# Patient Record
Sex: Male | Born: 1960 | Race: Black or African American | Hispanic: No | Smoking: Current every day smoker
Health system: Southern US, Community
[De-identification: ages and names within clinical notes are randomized; demographics above are authoritative.]

## PROBLEM LIST (undated history)

## (undated) DIAGNOSIS — F1010 Alcohol abuse, uncomplicated: Secondary | ICD-10-CM

## (undated) DIAGNOSIS — F419 Anxiety disorder, unspecified: Secondary | ICD-10-CM

## (undated) DIAGNOSIS — I10 Essential (primary) hypertension: Secondary | ICD-10-CM

## (undated) DIAGNOSIS — D649 Anemia, unspecified: Secondary | ICD-10-CM

## (undated) DIAGNOSIS — N179 Acute kidney failure, unspecified: Secondary | ICD-10-CM

## (undated) DIAGNOSIS — I824Y9 Acute embolism and thrombosis of unspecified deep veins of unspecified proximal lower extremity: Secondary | ICD-10-CM

## (undated) HISTORY — DX: Essential (primary) hypertension: I10

## (undated) HISTORY — DX: Anxiety disorder, unspecified: F41.9

## (undated) HISTORY — PX: TOOTH EXTRACTION: SUR596

---

## 2013-01-30 ENCOUNTER — Ambulatory Visit: Payer: Self-pay | Admitting: Internal Medicine

## 2013-08-12 ENCOUNTER — Emergency Department (HOSPITAL_COMMUNITY): Payer: Managed Care, Other (non HMO)

## 2013-08-12 ENCOUNTER — Encounter (HOSPITAL_COMMUNITY): Payer: Self-pay | Admitting: Emergency Medicine

## 2013-08-12 ENCOUNTER — Emergency Department (HOSPITAL_COMMUNITY)
Admission: EM | Admit: 2013-08-12 | Discharge: 2013-08-12 | Disposition: A | Discharge status: Home / Self Care | Payer: Managed Care, Other (non HMO) | Attending: Emergency Medicine | Admitting: Emergency Medicine

## 2013-08-12 DIAGNOSIS — F17200 Nicotine dependence, unspecified, uncomplicated: Secondary | ICD-10-CM | POA: Insufficient documentation

## 2013-08-12 DIAGNOSIS — R079 Chest pain, unspecified: Secondary | ICD-10-CM

## 2013-08-12 DIAGNOSIS — F411 Generalized anxiety disorder: Secondary | ICD-10-CM | POA: Insufficient documentation

## 2013-08-12 DIAGNOSIS — Z86718 Personal history of other venous thrombosis and embolism: Secondary | ICD-10-CM | POA: Insufficient documentation

## 2013-08-12 DIAGNOSIS — F419 Anxiety disorder, unspecified: Secondary | ICD-10-CM

## 2013-08-12 DIAGNOSIS — R072 Precordial pain: Secondary | ICD-10-CM | POA: Insufficient documentation

## 2013-08-12 DIAGNOSIS — F3289 Other specified depressive episodes: Secondary | ICD-10-CM | POA: Insufficient documentation

## 2013-08-12 DIAGNOSIS — R000 Tachycardia, unspecified: Secondary | ICD-10-CM | POA: Insufficient documentation

## 2013-08-12 DIAGNOSIS — F329 Major depressive disorder, single episode, unspecified: Secondary | ICD-10-CM | POA: Insufficient documentation

## 2013-08-12 LAB — D-DIMER, QUANTITATIVE: D-Dimer, Quant: <0.27 ug/mL-FEU (ref 0.00–0.48)

## 2013-08-12 LAB — CBC
HEMATOCRIT: 46.1 % (ref 39.0–52.0)
Hemoglobin: 16.6 g/dL (ref 13.0–17.0)
MCH: 31.7 pg (ref 26.0–34.0)
MCHC: 36 g/dL (ref 30.0–36.0)
MCV: 88.1 fL (ref 78.0–100.0)
Platelets: 267 10*3/uL (ref 150–400)
RBC: 5.23 MIL/uL (ref 4.22–5.81)
RDW: 14.1 % (ref 11.5–15.5)
WBC: 7.1 10*3/uL (ref 4.0–10.5)

## 2013-08-12 LAB — BASIC METABOLIC PANEL
BUN: 18 mg/dL (ref 6–23)
CALCIUM: 9.5 mg/dL (ref 8.4–10.5)
CO2: 19 mEq/L (ref 19–32)
Chloride: 90 mEq/L — ABNORMAL LOW (ref 96–112)
Creatinine, Ser: 0.9 mg/dL (ref 0.50–1.35)
GFR calc Af Amer: >90 mL/min (ref >90)
Glucose, Bld: 106 mg/dL — ABNORMAL HIGH (ref 70–99)
Potassium: 4 mEq/L (ref 3.7–5.3)
Sodium: 132 mEq/L — ABNORMAL LOW (ref 137–147)

## 2013-08-12 LAB — I-STAT TROPONIN, ED: Troponin i, poc: 0 ng/mL (ref 0.00–0.08)

## 2013-08-12 LAB — TROPONIN I: Troponin I: <0.3 ng/mL (ref <0.30)

## 2013-08-12 MED ORDER — ASPIRIN 81 MG PO CHEW: 81.0000 mg | CHEWABLE_TABLET | Freq: Every day | ORAL | Status: DC

## 2013-08-12 MED ORDER — SODIUM CHLORIDE 0.9 % IV BOLUS (SEPSIS)
1000.0000 mL | Freq: Once | INTRAVENOUS | Status: AC
  Administered 2013-08-12: 1000 mL via INTRAVENOUS

## 2013-08-12 NOTE — ED Notes (Signed)
Pt went to an urgent care and was having left sided chest pressure with radiation under axilla.  No sob, diaphoresis or nausea.  Pt thought he may have been having a panic attack and reports just losing father, no history of panic attacks.  Pt states has missed three days of not taking BP meds

## 2013-08-12 NOTE — ED Provider Notes (Signed)
CSN: [id number]     Arrival date & time 08/12/13  1619 History   First MD Initiated Contact with Patient 08/12/13 1729     Chief Complaint  Patient presents with  . Chest Pain     (Consider location/radiation/quality/duration/timing/severity/associated sxs/prior Treatment) HPI Comments: 53 yo AA male with h/o smoking presents to ER with cc chest pain and anxiety. [facility]  EKG was performed and he was found to be tachycardia.  Given ASA x4.  [facility]   O: yesterday, intermittent symptoms;  P: n/a Q: ache R: chest S:mild T: pt buried his father 1 week ago.  [place]  [place] on 2[place].  Pt hasn't been taking BP rx for 3 days.  Pt suspects panic attack, but isn't sure.    Currently pt is pain free.    Patient is a 53 y.o. male presenting with chest pain. The history is provided by the patient. The history is limited by a language barrier.  Chest Pain Pain location:  Substernal area and L chest Pain quality: aching   Pain radiates to:  L arm Pain radiates to the back: no   Pain severity:  Mild Duration:  2 days Timing:  Intermittent Progression:  Waxing and waning Chronicity:  New Context: not breathing, no drug use, not eating, no intercourse, not lifting, no movement, not raising an arm, not at rest, no stress and no trauma   Relieved by:  None tried Worsened by:  Nothing tried Associated symptoms: anxiety   Associated symptoms: no cough, no palpitations and no shortness of breath   Associated symptoms comment:  Anxiety Risk factors: hypertension, male sex, prior DVT/PE and smoking   Risk factors: no aortic disease, no birth control, no coronary artery disease, no diabetes mellitus, no Ehlers-Danlos syndrome, no high cholesterol, no immobilization, not obese, not pregnant and no surgery   Risk factors comment:  H/o DVT 2005, 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 No pertinent past medical history. History reviewed. No pertinent past surgical history. No family history on file. History  Substance Use Topics  . Smoking status: Current Every Day Smoker  . Smokeless tobacco: Not on file  . Alcohol Use: Yes     Comment: occ    Review of Systems  Constitutional: Negative.   HENT: Negative.   Eyes: Negative.   Respiratory: Positive for chest tightness. Negative for apnea, cough, choking, shortness of breath, wheezing and stridor.   Cardiovascular: Positive for chest pain. Negative for palpitations and leg swelling.  Gastrointestinal: Negative.   Endocrine: Negative.   Genitourinary: Negative.   Musculoskeletal: Negative.   Allergic/Immunologic: Negative.   Neurological: Negative.   Hematological: Negative.   Psychiatric/Behavioral:       Depressed over loss of father      Allergies  Review of patient's allergies indicates no known allergies.  Home Medications  No current outpatient prescriptions on file. BP 138/88  Pulse 125  Temp(Src) 98.5 F (36.9 C) (Oral)  Resp 23  SpO2 100% Physical Exam  Nursing note and vitals reviewed. Constitutional: He is oriented to person, place, and time. He appears well-developed and well-nourished.  HENT:  Head: Normocephalic and atraumatic.  Eyes: Conjunctivae are normal. Right eye exhibits no discharge. Left eye exhibits no discharge.  Neck: Normal range of motion. Neck supple. No JVD present. No tracheal  deviation present.  Cardiovascular: S1 normal, S2 normal and normal pulses.  Tachycardia present.  Exam reveals no gallop, no distant heart sounds and no friction rub.   Pulmonary/Chest: Effort normal and breath sounds normal. No stridor. He has no wheezes. He has no rales.  Abdominal: Soft. Bowel sounds are normal. He exhibits no distension and no mass. There is no tenderness. There is no rebound and no guarding.  Musculoskeletal: Normal range of motion. He exhibits no  edema and no tenderness.  Neurological: He is alert and oriented to person, place, and time. He has normal reflexes.  Skin: Skin is warm and dry.    ED Course  Procedures (including critical care time) Labs Review Labs Reviewed  CBC  BASIC METABOLIC PANEL  I-STAT TROPOININ, ED   Imaging Review No results found.   EKG Interpretation   Date/Time:  Monday August 12 2013 16:35:18 EST Ventricular Rate:  148 PR Interval:  130 QRS Duration: 76 QT Interval:  274 QTC Calculation: 430 R Axis:   75 Text Interpretation:  Sinus tachycardia ST \\T \ T wave abnormality,  consider inferior ischemia Abnormal ECG Confirmed by [facility]  MD, Keelan Tripodi  ([id number]) on 08/12/2013 5:30:29 PM      CBC, BMP, Trop, DDimer negative. Trop # 1 negative.     MDM   Final diagnoses:  Chest pain  Tachycardia  Anxiety   53 year old [place] American male with past mental history of a remote DVT and history of anxiety presents emergency department with tachycardia and chest pain. Patient recently lost his father last week. He attended a funeral in [place]. He flew back and forth from [place] last week. He has been upset regarding the loss of his father.  Patient initially had tachycardia which resolves with IV fluids and while resting in bed.  8:54 PM Initial workup neg.  CXR, EKG (other than tachycardia), trop, DDimer, CBC, BMP, CXR.  Plan for two set troponin and EKG r/o.  Low risk based on HEART score.  After first set of labs, EKG, chest x-ray patient was feeling much better and requested discharge home. I convinced him to allow is to do a 2 set rule out.Repeat EKG with Sinus tachycardia at 109, no ischemic changes.    Prolonged ER stay awaiting second troponin. Lab called at 2223.  Second troponin is a little as negative. Plan to discharge patient home with strict ER return precautions and possible outpatient evaluation. I suspect the patient's symptoms are multifactorial, however I doubt acute myocardial  infarction, PE, aortic dissection, or other emergent etiology. Patient agrees with outpatient plan.  Darlys Gales, MD 08/12/13 [phone]

## 2013-08-12 NOTE — Discharge Instructions (Signed)
Chest Pain (Nonspecific) Chest pain has many causes. Your pain could be caused by something serious, such as a heart attack or a blood clot in the lungs. It could also be caused by something less serious, such as a chest bruise or a virus. Follow up with your doctor. More lab tests or other studies may be needed to find the cause of your pain. Most of the time, nonspecific chest pain will improve within 2 to 3 days of rest and mild pain medicine. HOME CARE  For chest bruises, you may put ice on the sore area for 15-20 minutes, 03-04 times a day. Do this only if it makes you feel better.  Put ice in a plastic bag.  Place a towel between the skin and the bag.  Rest for the next 2 to 3 days.  Go back to work if the pain improves.  See your doctor if the pain lasts longer than 1 to 2 weeks.  Only take medicine as told by your doctor.  Quit smoking if you smoke. GET HELP RIGHT AWAY IF:   There is more pain or pain that spreads to the arm, neck, jaw, back, or belly (abdomen).  You have shortness of breath.  You cough more than usual or cough up blood.  You have very bad back or belly pain, feel sick to your stomach (nauseous), or throw up (vomit).  You have very bad weakness.  You pass out (faint).  You have a fever. Any of these problems may be serious and may be an emergency. Do not wait to see if the problems will go away. Get medical help right away. Call your local emergency services 911 in [place] Do not drive yourself to the hospital. MAKE SURE YOU:   Understand these instructions.  Will watch this condition.  Will get help right away if you or your child is not doing well or gets worse. Document Released: 11/16/2007 Document Revised: 08/22/2011 Document Reviewed: 11/16/2007 [facility] Patient Information 2014 [place] [place].  Panic Attacks Panic attacks are sudden, short-livedsurges of severe anxiety, fear, or discomfort. They may occur for no reason when you are  relaxed, when you are anxious, or when you are sleeping. Panic attacks may occur for a number of reasons:   Healthy people occasionally have panic attacks in extreme, life-threatening situations, such as war or natural disasters. Normal anxiety is a protective mechanism of the body that helps us react to danger (fight or flight response).  Panic attacks are often seen with anxiety disorders, such as panic disorder, social anxiety disorder, generalized anxiety disorder, and phobias. Anxiety disorders cause excessive or uncontrollable anxiety. They may interfere with your relationships or other life activities.  Panic attacks are sometimes seen with other mental illnesses such as depression and posttraumatic stress disorder.  Certain medical conditions, prescription medicines, and drugs of abuse can cause panic attacks. SYMPTOMS  Panic attacks start suddenly, peak within 20 minutes, and are accompanied by four or more of the following symptoms:  Pounding heart or fast heart rate (palpitations).  Sweating.  Trembling or shaking.  Shortness of breath or feeling smothered.  Feeling choked.  Chest pain or discomfort.  Nausea or strange feeling in your stomach.  Dizziness, lightheadedness, or feeling like you will faint.  Chills or hot flushes.  Numbness or tingling in your lips or hands and feet.  Feeling that things are not real or feeling that you are not yourself.  Fear of losing control or going crazy.  Fear  of dying. Some of these symptoms can mimic serious medical conditions. For example, you may think you are having a heart attack. Although panic attacks can be very scary, they are not life threatening. DIAGNOSIS  Panic attacks are diagnosed through an assessment by your health care provider. Your health care provider will ask questions about your symptoms, such as where and when they occurred. Your health care provider will also ask about your medical history and use of  alcohol and drugs, including prescription medicines. Your health care provider may order blood tests or other studies to rule out a serious medical condition. Your health care provider may refer you to a mental health professional for further evaluation. TREATMENT   Most healthy people who have one or two panic attacks in an extreme, life-threatening situation will not require treatment.  The treatment for panic attacks associated with anxiety disorders or other mental illness typically involves counseling with a mental health professional, medicine, or a combination of both. Your health care provider will help determine what treatment is best for you.  Panic attacks due to physical illness usually goes away with treatment of the illness. If prescription medicine is causing panic attacks, talk with your health care provider about stopping the medicine, decreasing the dose, or substituting another medicine.  Panic attacks due to alcohol or drug abuse goes away with abstinence. Some adults need professional help in order to stop drinking or using drugs. HOME CARE INSTRUCTIONS   Take all your medicines as prescribed.   Check with your health care provider before starting new prescription or over-the-counter medicines.  Keep all follow up appointments with your health care provider. SEEK MEDICAL CARE IF:  You are not able to take your medicines as prescribed.  Your symptoms do not improve or get worse. SEEK IMMEDIATE MEDICAL CARE IF:   You experience panic attack symptoms that are different than your usual symptoms.  You have serious thoughts about hurting yourself or others.  You are taking medicine for panic attacks and have a serious side effect. MAKE SURE YOU:  Understand these instructions.  Will watch your condition.  Will get help right away if you are not doing well or get worse. Document Released: 05/30/2005 Document Revised: 03/20/2013 Document Reviewed:  01/11/2013 [facility] Patient Information 2014 [place], [place].

## 2013-09-17 ENCOUNTER — Ambulatory Visit (INDEPENDENT_AMBULATORY_CARE_PROVIDER_SITE_OTHER): Payer: 59 | Admitting: Psychiatry

## 2013-09-17 DIAGNOSIS — F17200 Nicotine dependence, unspecified, uncomplicated: Secondary | ICD-10-CM

## 2013-09-17 DIAGNOSIS — F4322 Adjustment disorder with anxiety: Secondary | ICD-10-CM

## 2013-09-17 DIAGNOSIS — F410 Panic disorder [episodic paroxysmal anxiety] without agoraphobia: Secondary | ICD-10-CM

## 2013-09-25 DIAGNOSIS — F419 Anxiety disorder, unspecified: Secondary | ICD-10-CM | POA: Insufficient documentation

## 2013-09-28 ENCOUNTER — Emergency Department (HOSPITAL_BASED_OUTPATIENT_CLINIC_OR_DEPARTMENT_OTHER): Payer: Managed Care, Other (non HMO)

## 2013-09-28 ENCOUNTER — Emergency Department (HOSPITAL_BASED_OUTPATIENT_CLINIC_OR_DEPARTMENT_OTHER)
Admission: EM | Admit: 2013-09-28 | Discharge: 2013-09-29 | Disposition: A | Discharge status: Home / Self Care | Payer: Managed Care, Other (non HMO) | Attending: Emergency Medicine | Admitting: Emergency Medicine

## 2013-09-28 ENCOUNTER — Encounter (HOSPITAL_BASED_OUTPATIENT_CLINIC_OR_DEPARTMENT_OTHER): Payer: Self-pay | Admitting: Emergency Medicine

## 2013-09-28 DIAGNOSIS — K700 Alcoholic fatty liver: Secondary | ICD-10-CM | POA: Insufficient documentation

## 2013-09-28 DIAGNOSIS — I10 Essential (primary) hypertension: Secondary | ICD-10-CM | POA: Insufficient documentation

## 2013-09-28 DIAGNOSIS — F17200 Nicotine dependence, unspecified, uncomplicated: Secondary | ICD-10-CM | POA: Insufficient documentation

## 2013-09-28 DIAGNOSIS — F411 Generalized anxiety disorder: Secondary | ICD-10-CM | POA: Insufficient documentation

## 2013-09-28 DIAGNOSIS — K2990 Gastroduodenitis, unspecified, without bleeding: Secondary | ICD-10-CM

## 2013-09-28 DIAGNOSIS — R079 Chest pain, unspecified: Secondary | ICD-10-CM | POA: Insufficient documentation

## 2013-09-28 DIAGNOSIS — Z792 Long term (current) use of antibiotics: Secondary | ICD-10-CM | POA: Insufficient documentation

## 2013-09-28 DIAGNOSIS — Z7982 Long term (current) use of aspirin: Secondary | ICD-10-CM | POA: Insufficient documentation

## 2013-09-28 DIAGNOSIS — Z79899 Other long term (current) drug therapy: Secondary | ICD-10-CM | POA: Insufficient documentation

## 2013-09-28 DIAGNOSIS — K2970 Gastritis, unspecified, without bleeding: Secondary | ICD-10-CM | POA: Insufficient documentation

## 2013-09-28 HISTORY — DX: Essential (primary) hypertension: I10

## 2013-09-28 LAB — D-DIMER, QUANTITATIVE: D-Dimer, Quant: 0.44 ug/mL-FEU (ref 0.00–0.48)

## 2013-09-28 LAB — CBC WITH DIFFERENTIAL/PLATELET
BASOS PCT: 1 % (ref 0–1)
Basophils Absolute: 0.1 10*3/uL (ref 0.0–0.1)
EOS ABS: 0 10*3/uL (ref 0.0–0.7)
Eosinophils Relative: 1 % (ref 0–5)
HEMATOCRIT: 40.9 % (ref 39.0–52.0)
HEMOGLOBIN: 14.8 g/dL (ref 13.0–17.0)
Lymphocytes Relative: 34 % (ref 12–46)
Lymphs Abs: 2.5 10*3/uL (ref 0.7–4.0)
MCH: 32.4 pg (ref 26.0–34.0)
MCHC: 36.2 g/dL — ABNORMAL HIGH (ref 30.0–36.0)
MCV: 89.5 fL (ref 78.0–100.0)
MONO ABS: 0.7 10*3/uL (ref 0.1–1.0)
MONOS PCT: 9 % (ref 3–12)
Neutro Abs: 4.2 10*3/uL (ref 1.7–7.7)
Neutrophils Relative %: 56 % (ref 43–77)
Platelets: 265 10*3/uL (ref 150–400)
RBC: 4.57 MIL/uL (ref 4.22–5.81)
RDW: 14.2 % (ref 11.5–15.5)
WBC: 7.5 10*3/uL (ref 4.0–10.5)

## 2013-09-28 LAB — BASIC METABOLIC PANEL
BUN: 14 mg/dL (ref 6–23)
CO2: 19 mEq/L (ref 19–32)
CREATININE: 0.9 mg/dL (ref 0.50–1.35)
Calcium: 9.4 mg/dL (ref 8.4–10.5)
Chloride: 89 mEq/L — ABNORMAL LOW (ref 96–112)
GFR calc Af Amer: >90 mL/min (ref >90)
GFR calc non Af Amer: >90 mL/min (ref >90)
Glucose, Bld: 85 mg/dL (ref 70–99)
Potassium: 4.4 mEq/L (ref 3.7–5.3)
Sodium: 134 mEq/L — ABNORMAL LOW (ref 137–147)

## 2013-09-28 LAB — TROPONIN I: Troponin I: <0.3 ng/mL (ref <0.30)

## 2013-09-28 MED ORDER — GI COCKTAIL ~~LOC~~
30.0000 mL | Freq: Once | ORAL | Status: AC
  Administered 2013-09-28: 30 mL via ORAL
  Filled 2013-09-28: qty 30

## 2013-09-28 MED ORDER — IOHEXOL 350 MG/ML SOLN: 100.0000 mL | Freq: Once | INTRAVENOUS | Status: AC | PRN

## 2013-09-28 NOTE — ED Provider Notes (Signed)
CSN: [id number]     Arrival date & time 09/28/13  2228 History  This chart was scribed for Andreyah Natividad Smitty Cords, MD by Ardelia Mems, ED Scribe. This patient was seen in room MH10/MH10 and the patient's care was started at 11:19 PM.   Chief Complaint  Patient presents with  . Tachycardia    Patient is a 53 y.o. male presenting with palpitations. The history is provided by the patient. No language interpreter was used.  Palpitations Palpitations quality:  Fast Onset quality:  Gradual Duration:  6 weeks Timing:  Intermittent Progression:  Unchanged Chronicity:  Chronic Context: anxiety and nicotine   Relieved by:  None tried Worsened by:  Alcohol Ineffective treatments:  None tried Associated symptoms: chest pain   Associated symptoms: no cough, no diaphoresis, no hemoptysis, no lower extremity edema, no orthopnea, no PND, no shortness of breath and no syncope   Risk factors: no heart disease, no hx of atrial fibrillation, no hx of PE, no hypercoagulable state and no hyperthyroidism     HPI Comments: Terry Peters is a 53 y.o. male with a history of HTN who presents to the Emergency Department complaining of tachycardia onset this morning. He states that he feels like his heart has been beating rapidly all day but ongoing for at least 6 weeks. Pt's pulse was taken to be 121 in triage. He reports associated mild chest He states that he was seen by his PCP diagnosed with anxiety, and he believes this is related. Pt was also seen on 08/12/13 for the same. He states that he has been told by his PCP to take a Baby Aspirin daily, but was not given any medications for anxiety. He notes that he has plans to see a Therapist, but that he must first have a complete physical, which he has scheduled for early next month. He states that he takes medications daily for his history of HTN. He states that he does not consume caffeine. He states that he smokes 0.5 packs/day. He reports that he normally drinks 0.5  pints of hennessy a day, but he has been drinking more lately to "calm himself down". He admits to drinking a full pint today. He denies using any recreational drugs. He denies any recent long car trips or plane trips. He denies leg swelling or any other symptoms.   Past Medical History  Diagnosis Date  . Hypertension    Past Surgical History  Procedure Laterality Date  . Tooth extraction     No family history on file. History  Substance Use Topics  . Smoking status: Current Every Day Smoker -- 0.50 packs/day    Types: Cigarettes  . Smokeless tobacco: Never Used  . Alcohol Use: Yes     Comment: drinks 1/2 pint over 2 days    Review of Systems  Constitutional: Negative for fever and diaphoresis.  Respiratory: Negative for cough, hemoptysis, chest tightness and shortness of breath.   Cardiovascular: Positive for chest pain. Negative for palpitations, orthopnea, leg swelling, syncope and PND.  Psychiatric/Behavioral: The patient is nervous/anxious.   All other systems reviewed and are negative.  Allergies  Review of patient's allergies indicates no known allergies.  Home Medications   Prior to Admission medications   Medication Sig Start Date End Date Taking? Authorizing Provider  amoxicillin (AMOXIL) 500 MG capsule Take 500 mg by mouth 3 (three) times daily.   Yes Historical Provider, MD  aspirin 81 MG chewable tablet Chew 1 tablet (81 mg total) by mouth daily.  08/12/13  Yes Darlys Galesavid Masneri, MD  lisinopril-hydrochlorothiazide (PRINZIDE,ZESTORETIC) 20-12.5 MG per tablet Take 0.5 tablets by mouth daily.   Yes Historical Provider, MD   Triage Vitals: BP 128/89  Pulse 111  Temp(Src) 98.9 F (37.2 C) (Oral)  Resp 19  Ht 5\' 9"  (1.753 m)  Wt 180 lb (81.647 kg)  BMI 26.57 kg/m2  SpO2 99%  Physical Exam  Nursing note and vitals reviewed. Constitutional: He is oriented to person, place, and time. He appears well-developed and well-nourished. No distress.  HENT:  Head:  Normocephalic and atraumatic.  Mouth/Throat: No oropharyngeal exudate.  Moist mucous membranes  Eyes: EOM are normal. Pupils are equal, round, and reactive to light.  Neck: Normal range of motion. Neck supple. No tracheal deviation present.  Cardiovascular: Normal rate, regular rhythm and intact distal pulses.   Intact peripheral pulses  Pulmonary/Chest: Effort normal. No respiratory distress. He has no wheezes. He has no rales.  Lungs CTA  Abdominal: Soft. There is no tenderness. There is no rebound and no guarding.  Gurgling in epigastric area  Musculoskeletal: Normal range of motion.  Neurological: He is alert and oriented to person, place, and time. He has normal reflexes.  Intact DTRs  Skin: Skin is warm and dry.  Psychiatric: His mood appears anxious.    ED Course  Procedures (including critical care time)  DIAGNOSTIC STUDIES: Oxygen Saturation is 99% on RA, normal by my interpretation.    COORDINATION OF CARE: 11:25 PM- Discussed lab findings. Will await CXR. Will order a GI cocktail. Also discussed the importance of drinking water as well as foods to avoid. Pt advised of plan for treatment and pt agrees.  11:42 PM- GI cocktail and 500 cc bolus were given.  Medications  gi cocktail (Maalox,Lidocaine,Donnatal) (30 mLs Oral Given 09/28/13 2351)   Labs Review Labs Reviewed  CBC WITH DIFFERENTIAL - Abnormal; Notable for the following:    MCHC 36.2 (*)    All other components within normal limits  BASIC METABOLIC PANEL - Abnormal; Notable for the following:    Sodium 134 (*)    Chloride 89 (*)    All other components within normal limits  TROPONIN I  D-DIMER, QUANTITATIVE   Imaging Review Dg Chest 2 View  09/28/2013   CLINICAL DATA:  Tachycardia.  EXAM: CHEST  2 VIEW  COMPARISON:  08/12/2013  FINDINGS: The heart size and mediastinal contours are within normal limits. Both lungs are clear. The visualized skeletal structures are unremarkable.  IMPRESSION: No active  cardiopulmonary disease.   Electronically Signed   By: Charlett NoseKevin  Dover M.D.   On: 09/28/2013 23:41     EKG Interpretation None      Date: 09/29/2013  Rate: 108  Rhythm: sinus tachycardia  QRS Axis: normal  Intervals: normal  ST/T Wave abnormalities: normal  Conduction Disutrbances:none  Narrative Interpretation:   Old EKG Reviewed: unchanged    MDM   Final diagnoses:  None  wells 0, no leg pain or swelling no cords.  Ruled out for PE.  Doubt DVT   Suspect a component of dehydration from alcohol in combination with HCTZ in BP med raising his HR.  Patient is also very anxious which contributes to increased HR.  The discomfort symptoms have been ongoing since the end of February without ceasing.  These are concurrent with increased ETOH intake and there is likely a ETOH gastritis component.  Have treated with IVF and GI cocktail and have counseled patient on his anxiety and increased ETOH consumption.  Patient needs to d/c alcohol and follow up with his PMD and therapist regarding symptoms.  Doubt cardiac etiology as symptoms are ongoing with negative ekg and troponin.    Outside records from [facility] have been reviewed.   I personally performed the services described in this documentation, which was scribed in my presence. The recorded information has been reviewed and is accurate.    Jasmine AweApril K Hykeem Ojeda-Rasch, MD 09/29/13 [phone]

## 2013-09-28 NOTE — ED Notes (Signed)
Reports feeling rapid heartbeat since 0700 am. Was seen recently and dx with anxiety

## 2013-09-29 MED ORDER — FAMOTIDINE 20 MG PO TABS: 20.0000 mg | ORAL_TABLET | Freq: Two times a day (BID) | ORAL | Status: DC

## 2013-09-29 MED ORDER — IOHEXOL 350 MG/ML SOLN
100.0000 mL | Freq: Once | INTRAVENOUS | Status: AC | PRN
  Administered 2013-09-29: 100 mL via INTRAVENOUS

## 2013-10-03 ENCOUNTER — Ambulatory Visit (INDEPENDENT_AMBULATORY_CARE_PROVIDER_SITE_OTHER): Payer: 59 | Admitting: Psychiatry

## 2013-10-03 DIAGNOSIS — F17200 Nicotine dependence, unspecified, uncomplicated: Secondary | ICD-10-CM

## 2013-10-03 DIAGNOSIS — F4322 Adjustment disorder with anxiety: Secondary | ICD-10-CM

## 2013-10-03 DIAGNOSIS — F410 Panic disorder [episodic paroxysmal anxiety] without agoraphobia: Secondary | ICD-10-CM

## 2013-10-09 ENCOUNTER — Encounter (HOSPITAL_BASED_OUTPATIENT_CLINIC_OR_DEPARTMENT_OTHER): Payer: Self-pay | Admitting: Emergency Medicine

## 2013-10-09 ENCOUNTER — Emergency Department (HOSPITAL_BASED_OUTPATIENT_CLINIC_OR_DEPARTMENT_OTHER)
Admission: EM | Admit: 2013-10-09 | Discharge: 2013-10-09 | Disposition: A | Discharge status: Home / Self Care | Payer: Managed Care, Other (non HMO) | Attending: Emergency Medicine | Admitting: Emergency Medicine

## 2013-10-09 DIAGNOSIS — Z792 Long term (current) use of antibiotics: Secondary | ICD-10-CM | POA: Insufficient documentation

## 2013-10-09 DIAGNOSIS — Z7982 Long term (current) use of aspirin: Secondary | ICD-10-CM | POA: Insufficient documentation

## 2013-10-09 DIAGNOSIS — R519 Headache, unspecified: Secondary | ICD-10-CM

## 2013-10-09 DIAGNOSIS — R000 Tachycardia, unspecified: Secondary | ICD-10-CM

## 2013-10-09 DIAGNOSIS — F1010 Alcohol abuse, uncomplicated: Secondary | ICD-10-CM

## 2013-10-09 DIAGNOSIS — F17200 Nicotine dependence, unspecified, uncomplicated: Secondary | ICD-10-CM | POA: Insufficient documentation

## 2013-10-09 DIAGNOSIS — I10 Essential (primary) hypertension: Secondary | ICD-10-CM | POA: Insufficient documentation

## 2013-10-09 DIAGNOSIS — Z79899 Other long term (current) drug therapy: Secondary | ICD-10-CM | POA: Insufficient documentation

## 2013-10-09 DIAGNOSIS — R51 Headache: Secondary | ICD-10-CM | POA: Insufficient documentation

## 2013-10-09 MED ORDER — ADULT MULTIVITAMIN W/MINERALS CH
ORAL_TABLET | ORAL | Status: AC
  Filled 2013-10-09: qty 1

## 2013-10-09 MED ORDER — FOLIC ACID 1 MG PO TABS
ORAL_TABLET | ORAL | Status: AC
  Filled 2013-10-09: qty 1

## 2013-10-09 MED ORDER — THIAMINE HCL 100 MG/ML IJ SOLN
Freq: Once | INTRAVENOUS | Status: DC
  Filled 2013-10-09: qty 1000

## 2013-10-09 MED ORDER — KETOROLAC TROMETHAMINE 30 MG/ML IJ SOLN
30.0000 mg | Freq: Once | INTRAMUSCULAR | Status: AC
  Administered 2013-10-09: 30 mg via INTRAVENOUS
  Filled 2013-10-09: qty 1

## 2013-10-09 MED ORDER — LORAZEPAM 2 MG PO TABS: 2.0000 mg | ORAL_TABLET | Freq: Four times a day (QID) | ORAL | Status: DC | PRN

## 2013-10-09 MED ORDER — LORAZEPAM 2 MG/ML IJ SOLN
2.0000 mg | Freq: Once | INTRAMUSCULAR | Status: AC
  Administered 2013-10-09: 2 mg via INTRAVENOUS
  Filled 2013-10-09: qty 1

## 2013-10-09 MED ORDER — VITAMIN B-1 100 MG PO TABS
100.0000 mg | ORAL_TABLET | Freq: Once | ORAL | Status: AC
  Administered 2013-10-09: 100 mg via ORAL
  Filled 2013-10-09: qty 1

## 2013-10-09 MED ORDER — SODIUM CHLORIDE 0.9 % IV BOLUS (SEPSIS)
1000.0000 mL | Freq: Once | INTRAVENOUS | Status: AC
  Administered 2013-10-09: 1000 mL via INTRAVENOUS

## 2013-10-09 NOTE — Discharge Instructions (Signed)
Alcohol Withdrawal °Anytime drug use is interfering with normal living activities it has become abuse. This includes problems with family and friends. Psychological dependence has developed when your mind tells you that the drug is needed. This is usually followed by physical dependence when a continuing increase of drugs are required to get the same feeling or "high." This is known as addiction or chemical dependency. A person's risk is much higher if there is a history of chemical dependency in the family. °Mild Withdrawal Following Stopping Alcohol, When Addiction or Chemical Dependency Has Developed °When a person has developed tolerance to alcohol, any sudden stopping of alcohol can cause uncomfortable physical symptoms. Most of the time these are mild and consist of tremors in the hands and increases in heart rate, breathing, and temperature. Sometimes these symptoms are associated with anxiety, panic attacks, and bad dreams. There may also be stomach upset. Normal sleep patterns are often interrupted with periods of inability to sleep (insomnia). This may last for 6 months. Because of this discomfort, many people choose to continue drinking to get rid of this discomfort and to try to feel normal. °Severe Withdrawal with Decreased or No Alcohol Intake, When Addiction or Chemical Dependency Has Developed °About five percent of alcoholics will develop signs of severe withdrawal when they stop using alcohol. One sign of this is development of generalized seizures (convulsions). Other signs of this are severe agitation and confusion. This may be associated with believing in things which are not real or seeing things which are not really there (delusions and hallucinations). Vitamin deficiencies are usually present if alcohol intake has been long-term. Treatment for this most often requires hospitalization and close observation. °Addiction can only be helped by stopping use of all chemicals. This is hard but may  save your life. With continual alcohol use, possible outcomes are usually loss of self respect and esteem, violence, and death. °Addiction cannot be cured but it can be stopped. This often requires outside help and the care of professionals. Treatment centers are listed in the yellow pages under Cocaine, Narcotics, and Alcoholics Anonymous. Most hospitals and clinics can refer you to a specialized care center. °It is not necessary for you to go through the uncomfortable symptoms of withdrawal. Your caregiver can provide you with medicines that will help you through this difficult period. Try to avoid situations, friends, or drugs that made it possible for you to keep using alcohol in the past. Learn how to say no. °It takes a long period of time to overcome addictions to all drugs, including alcohol. There may be many times when you feel as though you want a drink. After getting rid of the physical addiction and withdrawal, you will have a lessening of the craving which tells you that you need alcohol to feel normal. Call your caregiver if more support is needed. Learn who to talk to in your family and among your friends so that during these periods you can receive outside help. Alcoholics Anonymous (AA) has helped many people over the years. To get further help, contact AA or call your caregiver, counselor, or clergyperson. Al-Anon and Alateen are support groups for friends and family members of an alcoholic. The people who love and care for an alcoholic often need help, too. For information about these organizations, check your phone directory or call a local alcoholism treatment center.  °SEEK IMMEDIATE MEDICAL CARE IF:  °· You have a seizure. °· You have a fever. °· You experience uncontrolled vomiting or you   vomit up blood. This may be bright red or look like black coffee grounds. °· You have blood in the stool. This may be bright red or appear as a black, tarry, bad-smelling stool. °· You become lightheaded or  faint. Do not drive if you feel this way. Have someone else drive you or call 911 for help. °· You become more agitated or confused. °· You develop uncontrolled anxiety. °· You begin to see things that are not really there (hallucinate). °Your caregiver has determined that you completely understand your medical condition, and that your mental state is back to normal. You understand that you have been treated for alcohol withdrawal, have agreed not to drink any alcohol for a minimum of 1 day, will not operate a car or other machinery for 24 hours, and have had an opportunity to ask any questions about your condition. °Document Released: 03/09/2005 Document Revised: 08/22/2011 Document Reviewed: 01/16/2008 °ExitCare® Patient Information ©2014 [employer]. ° ° °Emergency Department Resource Guide °1) Find a Doctor and Pay Out of Pocket °Although you won't have to find out who is covered by your insurance plan, it is a good idea to ask around and get recommendations. You will then need to call the office and see if the doctor you have chosen will accept you as a new patient and what types of options they offer for patients who are self-pay. Some doctors offer discounts or will set up payment plans for their patients who do not have insurance, but you will need to ask so you aren't surprised when you get to your appointment. ° °2) Contact Your Local Health Department °Not all health departments have doctors that can see patients for sick visits, but many do, so it is worth a call to see if yours does. If you don't know where your local health department is, you can check in your phone book. The CDC also has a tool to help you locate your state's health department, and many state websites also have listings of all of their local health departments. ° °3) Find a Walk-in Clinic °If your illness is not likely to be very severe or complicated, you may want to try a walk in clinic. These are popping up all over the country  in pharmacies, drugstores, and shopping centers. They're usually staffed by nurse practitioners or physician assistants that have been trained to treat common illnesses and complaints. They're usually fairly quick and inexpensive. However, if you have serious medical issues or chronic medical problems, these are probably not your best option. ° °No Primary Care Doctor: °- Call Health Connect at  [phone] - they can help you locate a primary care doctor that  accepts your insurance, provides certain services, etc. °- Physician Referral Service- 1-800-533-3463 ° °Chronic Pain Problems: °Organization         Address  Phone   Notes  °[facility]  [phone] Patients need to be referred by their primary care doctor.  ° °Medication Assistance: °Organization         Address  Phone   Notes  °[place] [facility] [address] °[place], [place] [address] [phone] --Must be a resident of [place] °-- Must have NO insurance coverage whatsoever (no Medicaid/ Medicare, etc.) °-- The pt. MUST have a primary care doctor that directs their care regularly and follows them in the community °  °[facility]ist  (866) [phone]   °[employer]  (888) 892-1162   ° °  Agencies that provide inexpensive medical care: Organization         Address  Phone   Notes  [place]  (647)[phone]   [facility]    [phone]   [facility] [place], [place] [address] [phone]   [place] [address], [place] [phone]   Planned Parenthood    [phone]   [facility]    [phone]   [place] [facility] [place] [address], [place] Phone:  [phone], Fax:  [phone] Hours of Operation:  9 am - 6 pm, M-F.  Also accepts Medicaid/Medicare and self-pay.  [facility] for [place] [address], [address],  [place] Phone: [phone], Fax: [phone]. Hours of Operation:  8:30 am - 5:30 pm, M-F.  Also accepts Medicaid and self-pay.  [facility] [place] [address], [place] Phone: [phone]   [place], [place], [place] [phone], Ext. [phone] Mondays & Thursdays: 7-9 AM.  First 15 patients are seen on a first come, first serve basis.    [place] Providers:  Organization         Address  Phone   Notes  [facility] [address], [place] [phone] Also accepts self-pay patients.  [facility] [address], [place]  [phone]   [place], [address], [place] [phone]   [facility] [address], [place] [phone]   Lucianne Lei [address], [place]   [phone] Only accepts [place] Access [place] patients after they have their name applied to their card.   Self-Pay (no insurance) in [facility]:  Organization         Address  Phone   Notes  Sickle Cell Patients, [facility] [place] [phone]   [facility] [place] [phone]   [facility] [place]  [place], [place], [place] [phone]   [facility]/Dr. Osei-Bonsu  [address] or [address], [place] [phone] Phone number for both [place] and [place] locations is the same.  [facility]gent [facility] and [facility] [address] [place] [phone]   [facility] [address], [place] or [address] [phone]   [facility] [address], [place] [phone], phone; [phone], fax Sees patients 1st and 3rd Saturday of every month.  Must not  qualify for public or private insurance (i.e. Medicaid, Medicare, [place] Health Choice, Veterans' Benefits)  Household income should be no more than 200% of the poverty level The clinic cannot treat you if you are pregnant or think you are pregnant  Sexually transmitted diseases are not treated at the clinic.    Dental Care: Organization         Address  Phone  Notes  [facility] [place] [phone] Accepts children up to age 15 who are enrolled in [place] or [place]; pregnant women with a Medicaid card; and children who have applied for Medicaid or  [place] Health Choice, but were declined, whose parents can pay a reduced fee at time of service.  [facility] of [facility]  [address], [place] [phone] Accepts children up to age 14 who are enrolled in [place] or [place]; pregnant women with a Medicaid card; and children who have applied for Medicaid or [place] Health Choice, but were declined, whose parents can pay a reduced fee at time of service.  [place] Adult Dental Access PROGRAM  [place] [phone] Patients are seen by appointment only. Walk-ins are not accepted. [place] will see patients 9 years of age and older. Monday - Tuesday (8am-5pm) Most Wednesdays (8:30-5pm) $30 per visit, cash only  [facility] Adult Dental Access PROGRAM  [address], [facility] [phone] Patients are seen by appointment only. Walk-ins are not accepted. [place] will see patients 76 years of age and older. One Wednesday Evening (Monthly: Volunteer Based).  $30 per visit, cash only  [place]  [phone] for adults; Children under age 19, call Graduate Pediatric Dentistry at [phone]. Children aged 64-14, please call [phone] to request a pediatric application.  Dental services are provided  in all areas of dental care including fillings, crowns and bridges, complete and partial dentures, implants, gum treatment, root canals, and extractions. Preventive care is also provided. Treatment is provided to both adults and children. Patients are selected via a lottery and there is often a waiting list.   [facility] [address] [place]  [phone] [email or web address]   [facility] [address] [place], [place] [phone], Ext. [phone] Second and Fourth Thursday of each month, opens at 6:30 AM; Clinic ends at 9 AM.  Patients are seen on a first-come first-served basis, and a limited number are seen during each clinic.   [facility]  [address] [place]ard Danker [place], [place] [phone]   Eligibility Requirements You must have lived in [place], [place], or [place] counties for at least the last three months.   You cannot be eligible for state or federal sponsored [employer], including [employer], [place], or [employer].   You generally cannot be eligible for healthcare insurance through your employer.    How to apply: Eligibility screenings are held every Tuesday and Wednesday afternoon from 1:00 pm until 4:00 pm. You do not need an appointment for the interview!  [facility] [address], [place], [place]   [place]  [place] Department  [place]  [phone]    Behavioral Health Resources in the Community: Intensive Outpatient Programs Organization         Address  Phone  Notes  [place]. [address] [place], [place] [phone]   [facility] Outpatient [address] [place], [place]   ADS: Alcohol [facility] Drug [facility] [address], [place], [place]   [place] [address],  [place], [place] or [phone]   Substance Abuse Resources Organization         Address  Phone  Notes  Alcohol and Drug Services  [phone]   [facility]  [phone]   [facility] [place]  [phone]   Chinita Pester  [phone]   Residential & Outpatient Substance Abuse Program  [phone]  Psychological Services Organization         Address  Phone  Notes  [facility] [place]  [place]  [phone]   [place] [address] [place] or [phone]    Mobile Crisis Teams Organization         Address  Phone  Notes  [facility], Mobile Crisis Care Unit  [phone]   [facility]  [address] [place], [place]   [place] [address] [place] [phone]    Self-Help/Support Groups Organization         Address  Phone             Notes  [place]. of [place] - variety of support groups  [place] Call for more information  Narcotics Anonymous (NA), [facility] [address], [employer] [place]  2 meetings at this location   Special educational needs teacher         Address  Phone  Notes  [facility] Residential Treatment [place],    [place]  [phone]   [facility]  [address] [place] [id number], [place], [place]   [place], [place] [phone] Admissions: 8am-3pm M-F  Incentives Substance [place] [address]    [place], [place] [phone]   [facility] [address] [place], [place], [place]   [facility] [address],  [place], [place]   [facility] - Intensive Outpatient [address] [place], [place], [place]   [facility] ([place].) [place].,  [place], [place] [phone] or  [phone]   [facility] ([facility]) [address] [place], [place] Accepts Medicaid  [facility] [place] [address],  [place] [phone] Substance Abuse/Addiction Treatment   [facility] Organization         Address  Phone  Notes  [facility]  [phone]   Domenic Schwab, PhD [address] [place]lis [place], [place]   [phone] or [phone]   [place], [place] [phone]   [facility] [address], [place], [place] [phone] Insurance/Medicaid/sponsorship through [facility] and Families [address], [address] [place]                                    [place], [place] [phone] [place] [address], [place] [phone]    Dr. Adele Schilder  [phone]   [facility] Dept. 1) [address] [place] 2) [place] 3)  [place] [address], [place] [phone]  [phone]   [place] [phone] or [phone] (After Hours)

## 2013-10-09 NOTE — ED Notes (Signed)
Developed a headache yesterday and states he feels anxious today. Admits to drinking 1-2 pints of liquor the past 4 days due to stress of the death of his father.  Last drink yesterday 14:00

## 2013-10-09 NOTE — ED Provider Notes (Signed)
CSN: [id number]     Arrival date & time 10/09/13  0800 History   First MD Initiated Contact with Patient 10/09/13 0818     Chief Complaint  Patient presents with  . Tachycardia     (Consider location/radiation/quality/duration/timing/severity/associated sxs/prior Treatment) HPI  This is a 53 year old male with history of hypertension and alcohol abuse who presents with headache. Patient states he had onset of headache yesterday. He reports that is throbbing and frontal. He states it was much worse yesterday but right now it is only 2/10. He denies any weakness, numbness, tingling, vision changes, or photophobia. Patient states that he thought was related to his blood pressure and that is why he came in. Patient also states that he has been drinking heavily over the last week. He reports one to 2 pints of liquor daily. Last drink was 2 PM yesterday. No history of alcohol withdrawal seizures. Patient reports increased stressors including the death of his father.  Patient is not interested in detox as an inpatient but would like outpatient resources for alcohol cessation. Patient denies any chest pain or shortness of breath, abdominal pain.  Past Medical History  Diagnosis Date  . Hypertension    Past Surgical History  Procedure Laterality Date  . Tooth extraction     No family history on file. History  Substance Use Topics  . Smoking status: Current Every Day Smoker -- 0.50 packs/day    Types: Cigarettes  . Smokeless tobacco: Never Used  . Alcohol Use: Yes     Comment: drinks 1-2 quarts daily for the past 4 days    Review of Systems  Constitutional: Negative.  Negative for fever.  Respiratory: Negative.  Negative for chest tightness and shortness of breath.   Cardiovascular: Negative.  Negative for chest pain, palpitations and leg swelling.  Gastrointestinal: Negative.  Negative for abdominal pain.  Genitourinary: Negative.  Negative for dysuria.  Neurological: Positive for  headaches. Negative for dizziness, speech difficulty, weakness and numbness.  All other systems reviewed and are negative.     Allergies  Peanuts and Strawberry  Home Medications   Prior to Admission medications   Medication Sig Start Date End Date Taking? Authorizing Provider  amoxicillin (AMOXIL) 500 MG capsule Take 500 mg by mouth 3 (three) times daily.    Historical Provider, MD  aspirin 81 MG chewable tablet Chew 1 tablet (81 mg total) by mouth daily. 08/12/13   Darlys Galesavid Masneri, MD  famotidine (PEPCID) 20 MG tablet Take 1 tablet (20 mg total) by mouth 2 (two) times daily. 09/29/13   April K Palumbo-Rasch, MD  lisinopril-hydrochlorothiazide (PRINZIDE,ZESTORETIC) 20-12.5 MG per tablet Take 0.5 tablets by mouth daily.    Historical Provider, MD   BP 124/81  Pulse 131  Temp(Src) 98.1 F (36.7 C) (Oral)  Resp 16  Ht 5\' 9"  (1.753 m)  Wt 180 lb (81.647 kg)  BMI 26.57 kg/m2  SpO2 98% Physical Exam  Nursing note and vitals reviewed. Constitutional: He is oriented to person, place, and time. He appears well-developed and well-nourished. No distress.  HENT:  Head: Normocephalic and atraumatic.  Mucous membranes dry  Eyes: EOM are normal. Pupils are equal, round, and reactive to light.  Neck: Neck supple.  No meningismus  Cardiovascular: Regular rhythm and normal heart sounds.   No murmur heard. Tachycardia  Pulmonary/Chest: Effort normal and breath sounds normal. No respiratory distress. He has no wheezes.  Abdominal: Soft. Bowel sounds are normal. There is no tenderness. There is no rebound.  Musculoskeletal: He  exhibits no edema.  Lymphadenopathy:    He has no cervical adenopathy.  Neurological: He is alert and oriented to person, place, and time.  Cranial nerves II through XII intact, 5 out of 5 strength in all 4 extremities, no dysmetria to finger-nose-finger  Skin: Skin is warm and dry.  Psychiatric: He has a normal mood and affect.    ED Course  Procedures (including  critical care time) Labs Review Labs Reviewed - No data to display  Imaging Review No results found.   EKG Interpretation   Date/Time:  Wednesday October 09 2013 08:12:43 EDT Ventricular Rate:  130 PR Interval:  150 QRS Duration: 82 QT Interval:  308 QTC Calculation: 453 R Axis:   48 Text Interpretation:  Sinus tachycardia Otherwise normal ECG similar to  prior Confirmed by HORTON  MD, COURTNEY ([id number]) on 10/09/2013 8:42:03 AM      MDM   Final diagnoses:  Tachycardia  Headache  Alcohol abuse    Patient presents with headache. Also noted to be tachycardic into the 130s. Review of patient's chart reveals that he is always somewhat tachycardic. Suspect tachycardia today may be related to mild dehydration and alcohol cessation for the last 18 hours. Patient was given fluids and thiamine. GIven IV ativan.  He was also treated for his headache. There are no red flags and patient has no evidence of infection or neurologic deficit.  11:24 AM On recheck, patient is resting comfortably. Heart rate is 95. Patient reports resolution of symptoms. Discussed with patient the danger of alcohol withdrawal in a nonmedical setting. Encouraged patient to followup with resources provided were monitored alcohol withdrawal. Will give patient a short prescription for Ativan. Patient stated understanding.  After history, exam, and medical workup I feel the patient has been appropriately medically screened and is safe for discharge home. Pertinent diagnoses were discussed with the patient. Patient was given return precautions.    Shon Batonourtney F Horton, MD 10/09/13 1125

## 2013-12-10 ENCOUNTER — Ambulatory Visit (HOSPITAL_COMMUNITY): Payer: Self-pay | Admitting: Psychiatry

## 2013-12-25 ENCOUNTER — Ambulatory Visit (INDEPENDENT_AMBULATORY_CARE_PROVIDER_SITE_OTHER): Payer: 59 | Admitting: Psychiatry

## 2013-12-25 ENCOUNTER — Encounter (INDEPENDENT_AMBULATORY_CARE_PROVIDER_SITE_OTHER): Payer: Self-pay

## 2013-12-25 ENCOUNTER — Encounter (HOSPITAL_COMMUNITY): Payer: Self-pay | Admitting: Psychiatry

## 2013-12-25 VITALS — BP 130/84 | HR 90 | Ht 66.0 in | Wt 180.0 lb

## 2013-12-25 DIAGNOSIS — F410 Panic disorder [episodic paroxysmal anxiety] without agoraphobia: Secondary | ICD-10-CM

## 2013-12-25 NOTE — Progress Notes (Signed)
Patient ID: Terry Peters, male   DOB: 1961/01/10, 53 y.o.   MRN: [id number]  [place] Initial Psychiatric Assessment   Terry Peters [id number] 53 y.o.  12/25/2013 10:46 AM  Chief Complaint:  Follow up for anxiety.  History of Present Illness:   Patient Presents for Initial Evaluation with symptoms of anxiety  Patient was referred from urgent care center he presented to emergency Center because of high pulse rate anxiety excessive worries. He endorses difficult year starting with a divorced in January. Father's that in February. Multiple heart attacks at the place where he is working with one fatality. 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  He had presented to the urgent care with palpitations feeling of having a heart attack significant anxiety excessive worries as if he's going to have one heart attack. His pulse was high he was managed and then referred for evaluation of anxiety. He was not sure why he is here and why to get a call from this office. In the meantime he is seeing a counselor but that and the counselor mentioned to him that she cannot help him that also has led to further anxiety in the past.  He bought books for cognitive behavioral therapy and anxiety management. He went through those skills and is feeling relief how to distract himself and also that panic attack would not kill him. He is currently on leave from his job and feels that he is doing better regarding his anxiety and excessive worries.  He does endorse that he keeps everything bottled up inside. He has had worries in the past that one day he can explode or it may heart him physically. He was not sure if this can really happened but he has been feeling stressed because off the recent happenings in life.  Since he has been doing his anxiety management skills he has not had a panic attack  for the last 3 weeks he feels that he is moving forward is not having a worry of having a heart attack. During the meantime he is seeing a cardiologist got everything worked up including an echocardiogram a stress test everything was normal physical examination and according to him the basic labs were all normal. He feels relief that his heart is functioning at a good level  He does endorse she drinks for shots every night to help him sleep he does not think it is excessive he has not had a DUI in the past he did not have any delirium or withdrawal symptoms in the past he does not believe it is a high amount and he can control it even further  No current psychotic symptoms or manic symptoms. Does not endorse hopelessness helplessness. He does not feel he is depressed but he understands he may be suppressing his stress or depression that have come out his anxiety. At times he feels unreal as of the things around him are unreal it happens once in a while and this had been happening for the last 2 or 3 years. No other hallucination or psychotic symptoms  Duration : 3 years worsened over last 76month. Cotnext : Job stress and people having hear attacks at work. Divorce and loosing father. Severity now; 3/10 . 0 being no anxiety,     Past Psychiatric History/Hospitalization(s) No prior psychitatric treatment. Has followed with counsellor for anxiety recently.   Hospitalization for  psychiatric illness: No History of Electroconvulsive Shock Therapy: No Prior Suicide Attempts: No  Medical History; Past Medical History  Diagnosis Date  . Hypertension   . Anxiety   . High blood pressure     Allergies: Allergies  Allergen Reactions  . Peanuts [Peanut Oil]   . Strawberry     Medications: Outpatient Encounter Prescriptions as of 12/25/2013  Medication Sig  . amoxicillin (AMOXIL) 500 MG capsule Take 500 mg by mouth 3 (three) times daily.  Marland Kitchen aspirin 81 MG chewable tablet Chew 1 tablet (81 mg  total) by mouth daily.  . famotidine (PEPCID) 20 MG tablet Take 1 tablet (20 mg total) by mouth 2 (two) times daily.  Marland Kitchen lisinopril-hydrochlorothiazide (PRINZIDE,ZESTORETIC) 20-12.5 MG per tablet Take 0.5 tablets by mouth daily.  Marland Kitchen LORazepam (ATIVAN) 2 MG tablet Take 1 tablet (2 mg total) by mouth every 6 (six) hours as needed (withdrawal symptoms).     Substance Abuse History:   Family History; No family history on file. Denies any family history pertinent to psychiatry   Biopsychosocial History:  Grew up with parents. No trauma. Active in sports. Continues to work Restaurant manager, fast food job. Married 2 times. Last one was 13 years but it grew apart and was difficult last years.    Labs:  Recent Results (from the past 2160 hour(s))  CBC WITH DIFFERENTIAL     Status: Abnormal   Collection Time    09/28/13 11:00 PM      Result Value Ref Range   WBC 7.5  4.0 - 10.5 K/uL   RBC 4.57  4.22 - 5.81 MIL/uL   Hemoglobin 14.8  13.0 - 17.0 g/dL   HCT 40.9  39.0 - 52.0 %   MCV 89.5  78.0 - 100.0 fL   MCH 32.4  26.0 - 34.0 pg   MCHC 36.2 (*) 30.0 - 36.0 g/dL   RDW 14.2  11.5 - 15.5 %   Platelets 265  150 - 400 K/uL   Neutrophils Relative % 56  43 - 77 %   Neutro Abs 4.2  1.7 - 7.7 K/uL   Lymphocytes Relative 34  12 - 46 %   Lymphs Abs 2.5  0.7 - 4.0 K/uL   Monocytes Relative 9  3 - 12 %   Monocytes Absolute 0.7  0.1 - 1.0 K/uL   Eosinophils Relative 1  0 - 5 %   Eosinophils Absolute 0.0  0.0 - 0.7 K/uL   Basophils Relative 1  0 - 1 %   Basophils Absolute 0.1  0.0 - 0.1 K/uL  BASIC METABOLIC PANEL     Status: Abnormal   Collection Time    09/28/13 11:00 PM      Result Value Ref Range   Sodium 134 (*) 137 - 147 mEq/L   Potassium 4.4  3.7 - 5.3 mEq/L   Chloride 89 (*) 96 - 112 mEq/L   CO2 19  19 - 32 mEq/L   Glucose, Bld 85  70 - 99 mg/dL   BUN 14  6 - 23 mg/dL   Creatinine, Ser 0.90  0.50 - 1.35 mg/dL   Calcium 9.4  8.4 - 10.5 mg/dL   GFR calc non Af Amer >90  >90 mL/min   GFR calc Af  Amer >90  >90 mL/min   Comment: (NOTE)     The eGFR has been calculated using the CKD EPI equation.     This calculation has not been validated in all clinical situations.     eGFR's  persistently <90 mL/min signify possible Chronic Kidney     Disease.  TROPONIN I     Status: None   Collection Time    09/28/13 11:00 PM      Result Value Ref Range   Troponin I <0.30  <0.30 ng/mL   Comment:            Due to the release kinetics of cTnI,     a negative result within the first hours     of the onset of symptoms does not rule out     myocardial infarction with certainty.     If myocardial infarction is still suspected,     repeat the test at appropriate intervals.  D-DIMER, QUANTITATIVE     Status: None   Collection Time    09/28/13 11:00 PM      Result Value Ref Range   D-Dimer, Quant 0.44  0.00 - 0.48 ug/mL-FEU   Comment:            AT THE INHOUSE ESTABLISHED CUTOFF     VALUE OF 0.48 ug/mL FEU,     THIS ASSAY HAS BEEN DOCUMENTED     IN THE LITERATURE TO HAVE     A SENSITIVITY AND NEGATIVE     PREDICTIVE VALUE OF AT LEAST     98 TO 99%.  THE TEST RESULT     SHOULD BE CORRELATED WITH     AN ASSESSMENT OF THE CLINICAL     PROBABILITY OF DVT / VTE.       Musculoskeletal: Strength & Muscle Tone: within normal limits Gait & Station: normal Patient leans: N/A  Mental Status Examination;   Psychiatric Specialty Exam: Physical Exam  Vitals reviewed. Constitutional: He appears well-developed and well-nourished.  HENT:  Head: Normocephalic.    Review of Systems  Constitutional: Negative.   Neurological: Negative for dizziness and tingling.  Psychiatric/Behavioral: Negative for depression, suicidal ideas, hallucinations, memory loss and substance abuse. The patient is nervous/anxious. The patient does not have insomnia.     Blood pressure 130/84, pulse 90, height 5' 6"  (1.676 m), weight 180 lb (81.647 kg).Body mass index is 29.07 kg/(m^2).  General Appearance: Casual  Eye  Contact::  Fair  Speech:  Normal Rate  Volume:  Normal  Mood:  Euthymic  Affect:  Congruent  Thought Process:  Coherent  Orientation:  Full (Time, Place, and Person)  Thought Content:  denies any psychotic symptoms or hallucinations  Suicidal Thoughts:  No  Homicidal Thoughts:  No  Memory:  Immediate;   Fair Recent;   Fair  Judgement:  Fair  Insight:  Fair  Psychomotor Activity:  Normal  Concentration:  Fair  Recall:  Fair  Akathisia:  Negative  Handed:  Right  AIMS (if indicated):     Assets:  Communication Skills Desire for Improvement Financial Resources/Insurance  Sleep:        Assessment: Axis I: Panic disorder or anxiety panic syndrome. Adjustment disorder with anxiety  Axis II: Deferred  Axis III:  Past Medical History  Diagnosis Date  . Hypertension   . Anxiety   . High blood pressure     Axis IV: Job stress, recent death in the family. Divorce in January.   Treatment Plan and Summary: He has not started on sertraline that was considered. Says that too many side effects and is not interested. He feels that he is doing better without medications. He is also not been taking Ativan. No panic attacks would last 3 weeks. 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 I do recommend that he continue counseling and talk about derealization episodes that occurred. Discussed  different medications that may be helpful if needed but he remains reluctant to be on any medication. Pertinent Labs and Relevant Prior Notes reviewed. Medication Side effects, benefits and risks reviewed/discussed with Patient. Time given for patient to respond and asks questions regarding the Diagnosis and Medications. Safety concerns and to report to ER if suicidal or call 911. Relevant Medications refilled or called in to pharmacy. Discussed weight maintenance and Sleep Hygiene. Follow up with Primary care provider in regards to Medical conditions. Recommend  compliance with medications and follow up office appointments. Discussed to avail opportunity to consider or/and continue Individual therapy with Counselor. Greater than 50% of time was spend in counseling and coordination of care with the patient.  Schedule for Follow up were discussed but he is not on medications and says follow up would not be needed. He will follow up with counsellors.   Merian Capron, MD 12/25/2013

## 2014-03-03 ENCOUNTER — Encounter (HOSPITAL_BASED_OUTPATIENT_CLINIC_OR_DEPARTMENT_OTHER): Payer: Self-pay | Admitting: Emergency Medicine

## 2014-03-03 ENCOUNTER — Emergency Department (HOSPITAL_BASED_OUTPATIENT_CLINIC_OR_DEPARTMENT_OTHER): Payer: Managed Care, Other (non HMO)

## 2014-03-03 ENCOUNTER — Inpatient Hospital Stay (HOSPITAL_BASED_OUTPATIENT_CLINIC_OR_DEPARTMENT_OTHER)
Admission: EM | Admit: 2014-03-03 | Discharge: 2014-03-04 | DRG: 641 | Disposition: A | Discharge status: Home / Self Care | Payer: Managed Care, Other (non HMO) | Attending: Internal Medicine | Admitting: Internal Medicine

## 2014-03-03 DIAGNOSIS — Z86718 Personal history of other venous thrombosis and embolism: Secondary | ICD-10-CM | POA: Diagnosis not present

## 2014-03-03 DIAGNOSIS — E872 Acidosis, unspecified: Principal | ICD-10-CM | POA: Diagnosis present

## 2014-03-03 DIAGNOSIS — Z7982 Long term (current) use of aspirin: Secondary | ICD-10-CM | POA: Diagnosis not present

## 2014-03-03 DIAGNOSIS — F10229 Alcohol dependence with intoxication, unspecified: Secondary | ICD-10-CM | POA: Diagnosis present

## 2014-03-03 DIAGNOSIS — R079 Chest pain, unspecified: Secondary | ICD-10-CM | POA: Diagnosis present

## 2014-03-03 DIAGNOSIS — F17200 Nicotine dependence, unspecified, uncomplicated: Secondary | ICD-10-CM | POA: Diagnosis present

## 2014-03-03 DIAGNOSIS — F10929 Alcohol use, unspecified with intoxication, unspecified: Secondary | ICD-10-CM | POA: Diagnosis present

## 2014-03-03 DIAGNOSIS — F411 Generalized anxiety disorder: Secondary | ICD-10-CM | POA: Diagnosis present

## 2014-03-03 DIAGNOSIS — F419 Anxiety disorder, unspecified: Secondary | ICD-10-CM | POA: Diagnosis present

## 2014-03-03 DIAGNOSIS — I10 Essential (primary) hypertension: Secondary | ICD-10-CM | POA: Diagnosis present

## 2014-03-03 DIAGNOSIS — R000 Tachycardia, unspecified: Secondary | ICD-10-CM | POA: Diagnosis present

## 2014-03-03 DIAGNOSIS — R0789 Other chest pain: Secondary | ICD-10-CM | POA: Diagnosis present

## 2014-03-03 DIAGNOSIS — R0602 Shortness of breath: Secondary | ICD-10-CM | POA: Diagnosis present

## 2014-03-03 DIAGNOSIS — Z79899 Other long term (current) drug therapy: Secondary | ICD-10-CM | POA: Diagnosis not present

## 2014-03-03 DIAGNOSIS — E876 Hypokalemia: Secondary | ICD-10-CM | POA: Diagnosis present

## 2014-03-03 DIAGNOSIS — F1010 Alcohol abuse, uncomplicated: Secondary | ICD-10-CM | POA: Diagnosis present

## 2014-03-03 HISTORY — DX: Acute embolism and thrombosis of unspecified deep veins of unspecified proximal lower extremity: I82.4Y9

## 2014-03-03 LAB — RAPID URINE DRUG SCREEN, HOSP PERFORMED
Amphetamines: NOT DETECTED
BARBITURATES: NOT DETECTED
Benzodiazepines: NOT DETECTED
COCAINE: NOT DETECTED
Opiates: NOT DETECTED
Tetrahydrocannabinol: NOT DETECTED

## 2014-03-03 LAB — TROPONIN I: Troponin I: <0.3 ng/mL (ref <0.30)

## 2014-03-03 LAB — APTT: APTT: 29 s (ref 24–37)

## 2014-03-03 LAB — COMPREHENSIVE METABOLIC PANEL
ALT: 10 U/L (ref 0–53)
AST: 49 U/L — AB (ref 0–37)
Albumin: 4.4 g/dL (ref 3.5–5.2)
Alkaline Phosphatase: 72 U/L (ref 39–117)
Anion gap: 31 — ABNORMAL HIGH (ref 5–15)
BUN: 13 mg/dL (ref 6–23)
CALCIUM: 9.1 mg/dL (ref 8.4–10.5)
CO2: 14 mEq/L — ABNORMAL LOW (ref 19–32)
Chloride: 91 mEq/L — ABNORMAL LOW (ref 96–112)
Creatinine, Ser: 0.9 mg/dL (ref 0.50–1.35)
GFR calc non Af Amer: >90 mL/min (ref >90)
Glucose, Bld: 74 mg/dL (ref 70–99)
Potassium: 4.6 mEq/L (ref 3.7–5.3)
SODIUM: 136 meq/L — AB (ref 137–147)
TOTAL PROTEIN: 8.2 g/dL (ref 6.0–8.3)
Total Bilirubin: 0.5 mg/dL (ref 0.3–1.2)

## 2014-03-03 LAB — URINALYSIS, ROUTINE W REFLEX MICROSCOPIC
Bilirubin Urine: NEGATIVE
Glucose, UA: NEGATIVE mg/dL
Hgb urine dipstick: NEGATIVE
KETONES UR: 40 mg/dL — AB
Leukocytes, UA: NEGATIVE
NITRITE: NEGATIVE
PROTEIN: NEGATIVE mg/dL
Specific Gravity, Urine: 1.023 (ref 1.005–1.030)
Urobilinogen, UA: 0.2 mg/dL (ref 0.0–1.0)
pH: 5 (ref 5.0–8.0)

## 2014-03-03 LAB — I-STAT VENOUS BLOOD GAS, ED
ACID-BASE DEFICIT: 9 mmol/L — AB (ref 0.0–2.0)
Bicarbonate: 17 mEq/L — ABNORMAL LOW (ref 20.0–24.0)
O2 SAT: 85 %
PO2 VEN: 56 mmHg — AB (ref 30.0–45.0)
TCO2: 18 mmol/L (ref 0–100)
pCO2, Ven: 35.2 mmHg — ABNORMAL LOW (ref 45.0–50.0)
pH, Ven: 7.292 (ref 7.250–7.300)

## 2014-03-03 LAB — CBC
HCT: 46.9 % (ref 39.0–52.0)
Hemoglobin: 16.5 g/dL (ref 13.0–17.0)
MCH: 31.9 pg (ref 26.0–34.0)
MCHC: 35.2 g/dL (ref 30.0–36.0)
MCV: 90.7 fL (ref 78.0–100.0)
PLATELETS: 306 10*3/uL (ref 150–400)
RBC: 5.17 MIL/uL (ref 4.22–5.81)
RDW: 13.5 % (ref 11.5–15.5)
WBC: 9.3 10*3/uL (ref 4.0–10.5)

## 2014-03-03 LAB — PROTIME-INR
INR: 1.17 (ref 0.00–1.49)
Prothrombin Time: 14.9 seconds (ref 11.6–15.2)

## 2014-03-03 LAB — I-STAT CG4 LACTIC ACID, ED: Lactic Acid, Venous: 5.22 mmol/L — ABNORMAL HIGH (ref 0.5–2.2)

## 2014-03-03 LAB — POTASSIUM: POTASSIUM: 4.4 meq/L (ref 3.7–5.3)

## 2014-03-03 LAB — ETHANOL: ALCOHOL ETHYL (B): 60 mg/dL — AB (ref 0–11)

## 2014-03-03 LAB — D-DIMER, QUANTITATIVE: D-Dimer, Quant: 0.28 ug/mL-FEU (ref 0.00–0.48)

## 2014-03-03 MED ORDER — LORAZEPAM 2 MG/ML IJ SOLN
0.5000 mg | Freq: Once | INTRAMUSCULAR | Status: AC
  Administered 2014-03-03: 0.5 mg via INTRAVENOUS
  Filled 2014-03-03: qty 1

## 2014-03-03 MED ORDER — SODIUM CHLORIDE 0.9 % IJ SOLN
3.0000 mL | Freq: Two times a day (BID) | INTRAMUSCULAR | Status: DC
  Administered 2014-03-04: 3 mL via INTRAVENOUS

## 2014-03-03 MED ORDER — VITAMIN B-1 100 MG PO TABS
100.0000 mg | ORAL_TABLET | Freq: Every day | ORAL | Status: DC
  Administered 2014-03-04: 100 mg via ORAL
  Filled 2014-03-03 (×2): qty 1

## 2014-03-03 MED ORDER — SODIUM CHLORIDE 0.9 % IV SOLN
1000.0000 mL | Freq: Once | INTRAVENOUS | Status: AC
  Administered 2014-03-03: 1000 mL via INTRAVENOUS

## 2014-03-03 MED ORDER — ASPIRIN EC 325 MG PO TBEC
325.0000 mg | DELAYED_RELEASE_TABLET | Freq: Every day | ORAL | Status: DC
  Administered 2014-03-04: 325 mg via ORAL
  Filled 2014-03-03: qty 1

## 2014-03-03 MED ORDER — IOHEXOL 350 MG/ML SOLN
100.0000 mL | Freq: Once | INTRAVENOUS | Status: AC | PRN
  Administered 2014-03-03: 100 mL via INTRAVENOUS

## 2014-03-03 MED ORDER — FOLIC ACID 1 MG PO TABS
1.0000 mg | ORAL_TABLET | Freq: Every day | ORAL | Status: DC
  Administered 2014-03-03 – 2014-03-04 (×2): 1 mg via ORAL
  Filled 2014-03-03 (×2): qty 1

## 2014-03-03 MED ORDER — PANTOPRAZOLE SODIUM 40 MG PO TBEC
40.0000 mg | DELAYED_RELEASE_TABLET | Freq: Every day | ORAL | Status: DC
  Administered 2014-03-03 – 2014-03-04 (×2): 40 mg via ORAL
  Filled 2014-03-03 (×2): qty 1

## 2014-03-03 MED ORDER — SODIUM CHLORIDE 0.9 % IV SOLN: 20.0000 mL | INTRAVENOUS | Status: DC

## 2014-03-03 MED ORDER — ENOXAPARIN SODIUM 40 MG/0.4ML ~~LOC~~ SOLN
40.0000 mg | SUBCUTANEOUS | Status: DC
  Administered 2014-03-03: 40 mg via SUBCUTANEOUS
  Filled 2014-03-03 (×2): qty 0.4

## 2014-03-03 MED ORDER — SODIUM CHLORIDE 0.9 % IV SOLN
20.0000 mL | INTRAVENOUS | Status: DC
  Administered 2014-03-03: 09:00:00 via INTRAVENOUS

## 2014-03-03 MED ORDER — ACETAMINOPHEN 650 MG RE SUPP: 650.0000 mg | Freq: Four times a day (QID) | RECTAL | Status: DC | PRN

## 2014-03-03 MED ORDER — SODIUM CHLORIDE 0.9 % IV SOLN
INTRAVENOUS | Status: DC
  Administered 2014-03-03: 18:00:00 via INTRAVENOUS

## 2014-03-03 MED ORDER — NITROGLYCERIN 0.4 MG SL SUBL
0.4000 mg | SUBLINGUAL_TABLET | SUBLINGUAL | Status: DC | PRN
  Administered 2014-03-03: 0.4 mg via SUBLINGUAL
  Filled 2014-03-03: qty 1

## 2014-03-03 MED ORDER — ADULT MULTIVITAMIN W/MINERALS CH
1.0000 | ORAL_TABLET | Freq: Every day | ORAL | Status: DC
  Administered 2014-03-03 – 2014-03-04 (×2): 1 via ORAL
  Filled 2014-03-03 (×2): qty 1

## 2014-03-03 MED ORDER — METOPROLOL TARTRATE 1 MG/ML IV SOLN
5.0000 mg | Freq: Once | INTRAVENOUS | Status: AC
  Administered 2014-03-03: 5 mg via INTRAVENOUS
  Filled 2014-03-03: qty 5

## 2014-03-03 MED ORDER — LORAZEPAM 2 MG/ML IJ SOLN
1.0000 mg | Freq: Four times a day (QID) | INTRAMUSCULAR | Status: DC | PRN
  Administered 2014-03-03: 1 mg via INTRAVENOUS
  Filled 2014-03-03: qty 1

## 2014-03-03 MED ORDER — NICOTINE 14 MG/24HR TD PT24
14.0000 mg | MEDICATED_PATCH | Freq: Every day | TRANSDERMAL | Status: DC
  Administered 2014-03-03 – 2014-03-04 (×2): 14 mg via TRANSDERMAL
  Filled 2014-03-03 (×2): qty 1

## 2014-03-03 MED ORDER — ASPIRIN 81 MG PO CHEW
324.0000 mg | CHEWABLE_TABLET | Freq: Once | ORAL | Status: AC
  Administered 2014-03-03: 324 mg via ORAL
  Filled 2014-03-03: qty 4

## 2014-03-03 MED ORDER — HYDROCODONE-ACETAMINOPHEN 5-325 MG PO TABS
1.0000 | ORAL_TABLET | ORAL | Status: DC | PRN
Start: 2014-03-03 — End: 2014-03-04

## 2014-03-03 MED ORDER — THIAMINE HCL 100 MG/ML IJ SOLN
100.0000 mg | Freq: Every day | INTRAMUSCULAR | Status: DC
  Administered 2014-03-03: 100 mg via INTRAVENOUS
  Filled 2014-03-03 (×2): qty 1

## 2014-03-03 MED ORDER — LORAZEPAM 1 MG PO TABS: 1.0000 mg | ORAL_TABLET | Freq: Four times a day (QID) | ORAL | Status: DC | PRN

## 2014-03-03 MED ORDER — ACETAMINOPHEN 325 MG PO TABS: 650.0000 mg | ORAL_TABLET | Freq: Four times a day (QID) | ORAL | Status: DC | PRN

## 2014-03-03 NOTE — Progress Notes (Signed)
PENDING ACCEPTANCE TRANFER NOTE:  Call received from:    Dr. Rosalia Hammers, patient requested to be admitted to [facility] rather than [facility]  REASON FOR REQUESTING TRANSFER:    CP/Tachycardia and lactic acidosis  HPI:   53 years old male presented with palpitations and chest pain. Patient has multiple visits to the ED with palpitation which presumed to be secondary to anxiety. Patient was found to have acidosis with bicarbonate of 14 anion gap of 31, lactic acid is 5.2. Patient has history of alcohol abuse, please verify with the patient, he drinks. Per EDP his exam is benign, denies abdominal pain, CT angio was done and showed no acute PE. His blood alcohol level is 60.   PLAN:  According to telephone report, this patient was accepted for transfer to [facility],   Under [facility] team:  [id number],  I have requested an order be written to call Flow Manager at [phone] upon patient arrival to the floor for final physician assignment who will do the admission and give admitting orders.  SIGNED: Clint Lipps, MD [facility]  03/03/2014, 1:06 PM

## 2014-03-03 NOTE — ED Notes (Addendum)
Awakened with palpitation, chest pressure x 10 minutes and feeling SHOB.

## 2014-03-03 NOTE — H&P (Signed)
History and Physical  Laurice Kimmons WGN:[id number] DOB: [birth year] DOA: 03/03/2014   PCP: PROVIDER NOT IN SYSTEM   Chief Complaint: Chest pain, shortness of breath  HPI:  53 year old male with a history of anxiety, hypertension, DVT, alcohol dependence presents with chest discomfort that woke him up from bed on the morning of admission at 5:30 AM. The patient also had associated palpitations or shortness of breath. The chest discomfort lasted approximately 10 minutes before it resolved spontaneously. The patient denied any dizziness, nausea, vomiting. He denies any fevers, chills, cough, hemoptysis. As a result of chest discomfort, the patient presented to [facility]. The patient's workup revealed a metabolic acidosis with bicarbonate 14. His lactic acid was 5.22. As a result, admission was advised.   the patient denies any abdominal pain, hematochezia, melena, dysuria, hematuria. He states that he drinks 4-5 shots of liquor on a daily basis for the past 10 years. He denies illegal drug use. The patient smokes one half pack per day. He denies any other over-the-counter medications or supplements. He is presently chest pain-free. Regarding his DVT, the patient had a DVT 10 years ago after which he was treated with 6 months of warfarin. He has not had a recurrence. CT angiogram of the chest was performed in the emergency department which was negative for pulmonary embolus. It revealed a fatty liver. The WBC 9.3, hemoglobin 16.5, platelets 36.  urine drug screen was negative.  Urinalysis showed 40 ketones.  The patient was given metoprolol 5 mg IV as well as 1 L normal saline.  Alcohol level 60. Assessment/Plan: Lactic acidosis/alcoholic ketosis  -This is contributing to the patient's metabolic acidosis  -Aggressive fluid resuscitation  -The patient has no abdominal symptoms, and his abdominal exam is completely benign  -He denies any medications or supplements which may be precipitating his lactic  acidosis  -Repeat lactic acid in the morning after fluid resuscitation  alcohol dependence -Alcohol withdrawal protocol -last drink was 03/02/14--630PM -PPI -Urine drug screen negative Hypertension -Restart lisinopril -Hold HCTZ Atypical chest pain  -CT angiogram of the chest negative for pulmonary embolus or aortic dissection  -Cycle troponins -EKG negative for ST-T wave changes Transaminasemia -secondary to alcohol and fatty liver Toabacco abuse -Tobacco cessation discussed  -Nicoderm patch        Past Medical History  Diagnosis Date  . Hypertension   . Anxiety   . High blood pressure   . 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 Tooth extraction     Social History:  reports that he has been smoking Cigarettes.  He has been smoking about 0.50 packs per day. He has never used smokeless tobacco. He reports that he drinks alcohol. He reports that he does not use illicit drugs.   History reviewed. No pertinent family history.   Allergies  Allergen Reactions  . Peanuts [Peanut Oil]   . Strawberry       Prior to Admission medications   Medication Sig Start Date End Date Taking? Authorizing Provider  amoxicillin (AMOXIL) 500 MG capsule Take 500 mg by mouth 3 (three) times daily.    Historical Provider, MD  aspirin 81 MG chewable tablet Chew 1 tablet (81 mg total) by mouth daily. 08/12/13   Darlys Gales, MD  famotidine (PEPCID) 20 MG tablet Take 1 tablet (20 mg total) by mouth 2 (two) times daily. 09/29/13   April Smitty Cords, MD  lisinopril-hydrochlorothiazide (PRINZIDE,ZESTORETIC) 20-12.5 MG  per tablet Take 0.5 tablets by mouth daily.    Historical Provider, MD  LORazepam (ATIVAN) 2 MG tablet Take 1 tablet (2 mg total) by mouth every 6 (six) hours as needed (withdrawal symptoms). 10/09/13   Shon Baton, MD    Review of Systems:  Constitutional:  No weight loss, night sweats, Fevers, chills, fatigue.   Head&Eyes: No headache.  No vision loss.  No eye pain or scotoma ENT:  No Difficulty swallowing,Tooth/dental problems,Sore throat,  No ear ache, post nasal drip,  Cardio-vascular:  NoOrthopnea, PND, swelling in lower extremities,  dizziness, palpitations  GI:  No  abdominal pain, nausea, vomiting, diarrhea, loss of appetite, hematochezia, melena, heartburn, indigestion, Resp:  No cough. No coughing up of blood .No wheezing.No chest wall deformity  Skin:  no rash or lesions.  GU:  no dysuria, change in color of urine, no urgency or frequency. No flank pain.  Musculoskeletal:  No joint pain or swelling. No decreased range of motion. No back pain.  Psych:  No change in mood or affect.  Neurologic: No headache, no dysesthesia, no focal weakness, no vision loss. No syncope  Physical Exam: Filed Vitals:   03/03/14 1036 03/03/14 1115 03/03/14 1344 03/03/14 1545  BP: 130/87 124/82 145/92 154/96  Pulse: 104 102 102 102  Temp:   98.6 F (37 C) 98.6 F (37 C)  TempSrc:    Oral  Resp: SpO2: 100%  100% 100%   General:  A&O x 3, NAD, nontoxic, pleasant/cooperative Head/Eye: No conjunctival hemorrhage, no icterus, [place]/AT, No nystagmus ENT:  No icterus,  No thrush, poor dentition, no pharyngeal exudate Neck:  No masses, no lymphadenpathy, no bruits CV:  RRR, no rub, no gallop, no S3 Lung:  CTAB, good air movement, no wheeze, no rhonchi Abdomen: soft/NT, +BS, nondistended, no peritoneal signs Ext: No cyanosis, No rashes, No petechiae, No lymphangitis, No edema Neuro: CNII-XII intact, strength 4/5 in bilateral upper and lower extremities, no dysmetria  Labs on Admission:  Basic Metabolic Panel:  Recent Labs Lab 03/03/14 0900 03/03/14 1355  NA 136*  --   K 4.6 4.4  CL 91*  --   CO2 14*  --   GLUCOSE 74  --   BUN 13  --   CREATININE 0.90  --   CALCIUM 9.1  --    Liver Function Tests:  Recent Labs Lab 03/03/14 0900  AST 49*  ALT 10  ALKPHOS 72  BILITOT 0.5    PROT 8.2  ALBUMIN 4.4   No results found for this basename: LIPASE, AMYLASE,  in the last 168 hours No results found for this basename: AMMONIA,  in the last 168 hours CBC:  Recent Labs Lab 03/03/14 0815  WBC 9.3  HGB 16.5  HCT 46.9  MCV 90.7  PLT 306   Cardiac Enzymes:  Recent Labs Lab 03/03/14 0900 03/03/14 1100  TROPONINI <0.30 <0.30   BNP: No components found with this basename: POCBNP,  CBG: No results found for this basename: GLUCAP,  in the last 168 hours  Radiological Exams on Admission: Ct Angio Chest Pe W/cm &/or Wo Cm  03/03/2014   CLINICAL DATA:  Chest pain.  EXAM: CT ANGIOGRAPHY CHEST WITH CONTRAST  TECHNIQUE: Multidetector CT imaging of the chest was performed using the standard protocol during bolus administration of intravenous contrast. Multiplanar CT image reconstructions and MIPs were obtained to evaluate the vascular anatomy.  CONTRAST:  OMNIPAQUE IOHEXOL 350 MG/ML SOLN  COMPARISON:  Chest x-ray 03/03/2014.  Chest CT 09/29/2013.  FINDINGS: Thoracic aorta normal caliber. Mild atherosclerotic vascular changes. No aneurysm or dissection. Pulmonary arteries are normal. No pulmonary embolus. Heart size normal.  No significant mediastinal or hilar adenopathy. Thoracic esophagus is unremarkable.  Large airways are patent. No focal pulmonary infiltrate. No pleural effusion or pneumothorax.  Diffuse fatty infiltration of the liver. Visualized upper abdominal structures are otherwise unremarkable.  Thyroid unremarkable. No significant axillary adenopathy. Chest wall is unremarkable. No acute bony abnormality .  Review of the MIP images confirms the above findings.  IMPRESSION: 1. No evidence of pulmonary embolus. No acute abnormality identified. 2. Diffuse fatty infiltration of the liver.   Electronically Signed   By: Maisie Fus  Register   On: 03/03/2014 08:59   Dg Chest Portable 1 View  03/03/2014   CLINICAL DATA:  Heart palpitations.  Shortness of breath.  EXAM:  PORTABLE CHEST - 1 VIEW  COMPARISON:  09/28/2013, 08/12/2013.  Chest CT 09/19/2013.  FINDINGS: The heart size and mediastinal contours are within normal limits. Both lungs are clear. The visualized skeletal structures are unremarkable.  IMPRESSION: No active disease.   Electronically Signed   By: Maisie Fus  Register   On: 03/03/2014 08:21    EKG: Independently reviewed. Sinus rhythm, no ST-T wave changes    Time spent:60 minutes Code Status:   FULL Family Communication:  No Family at bedside   Justyce Baby, DO  [facility] Pager [phone]  If 7PM-7AM, please contact night-coverage [email or web address] Password [facility] 03/03/2014, 4:58 PM

## 2014-03-03 NOTE — ED Notes (Signed)
Pt reports he feels really anxious. Denies chest pain/pressure, however states his chest feels tight.  EDP informed.

## 2014-03-03 NOTE — ED Notes (Signed)
Patient transported to CT 

## 2014-03-03 NOTE — ED Provider Notes (Signed)
CSN: [id number]     Arrival date & time 03/03/14  0736 History   First MD Initiated Contact with Patient 03/03/14 0757     Chief Complaint  Patient presents with  . Palpitations     (Consider location/radiation/quality/duration/timing/severity/associated sxs/prior Treatment) HPI 53 year old male with a history of anxiety, hypertension, and DVT who presents today after awakening at 5:30 AM with palpitations and chest pressure. He states the chest pressure lasted for about 10 minutes and then spontaneously resolved. He has continued to feel like his heart is beating fast and he feels short of breath. This is consistent with his previous anxiety. He states that he was in his normal state of health yesterday when he went to bed. He did wake up somewhat earlier than usual. He drank alcohol yesterday evening at about his usual amount but not after 6 PM. He states he otherwise ate and drank as usual. He denies any fever, chest wall injury, or swelling in his legs. The DVT was 10 years ago he was treated for approximately 6 months with anticoagulants at that time per his recall. Past Medical History  Diagnosis Date  . Hypertension   . Anxiety   . High blood pressure   . 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 Tooth extraction     No family history on file. History  Substance Use Topics  . Smoking status: Current Every Day Smoker -- 0.50 packs/day    Types: Cigarettes  . Smokeless tobacco: Never Used  . Alcohol Use: Yes     Comment: 4-5 shots daily- last drink 1830    Review of Systems  All other systems reviewed and are negative.     Allergies  Peanuts and Strawberry  Home Medications   Prior to Admission medications   Medication Sig Start Date End Date Taking? Authorizing Provider  amoxicillin (AMOXIL) 500 MG capsule Take 500 mg by mouth 3 (three) times daily.    Historical Provider, MD  aspirin 81 MG chewable tablet  Chew 1 tablet (81 mg total) by mouth daily. 08/12/13   Darlys Gales, MD  famotidine (PEPCID) 20 MG tablet Take 1 tablet (20 mg total) by mouth 2 (two) times daily. 09/29/13   April K Palumbo-Rasch, MD  lisinopril-hydrochlorothiazide (PRINZIDE,ZESTORETIC) 20-12.5 MG per tablet Take 0.5 tablets by mouth daily.    Historical Provider, MD  LORazepam (ATIVAN) 2 MG tablet Take 1 tablet (2 mg total) by mouth every 6 (six) hours as needed (withdrawal symptoms). 10/09/13   Shon Baton, MD   BP 130/76  Pulse 141  Temp(Src) 98.3 F (36.8 C) (Oral)  Resp 18  SpO2 98% Physical Exam  Nursing note and vitals reviewed. Constitutional: He is oriented to person, place, and time. He appears well-developed and well-nourished.  HENT:  Head: Normocephalic and atraumatic.  Right Ear: External ear normal.  Left Ear: External ear normal.  Nose: Nose normal.  Mouth/Throat: Oropharynx is clear and moist.  Eyes: Conjunctivae and EOM are normal. Pupils are equal, round, and reactive to light.  Neck: Normal range of motion. Neck supple.  Cardiovascular: Regular rhythm, normal heart sounds and intact distal pulses.  Tachycardia present.   Pulmonary/Chest: Effort normal and breath sounds normal. No respiratory distress. He has no wheezes. He exhibits no tenderness.  Abdominal: Soft. Bowel sounds are normal. He exhibits no distension and no mass. There is no tenderness. There is no guarding.  Musculoskeletal: Normal range of motion.  He exhibits no edema and no tenderness.  Neurological: He is alert and oriented to person, place, and time. He has normal reflexes. He exhibits normal muscle tone. Coordination normal.  Skin: Skin is warm and dry.  Psychiatric: He has a normal mood and affect. His behavior is normal. Judgment and thought content normal.    ED Course  Procedures (including critical care time) Labs Review Labs Reviewed  CBC  URINALYSIS, ROUTINE W REFLEX MICROSCOPIC  APTT  COMPREHENSIVE METABOLIC  PANEL  PROTIME-INR  TROPONIN I  D-DIMER, QUANTITATIVE    Imaging Review Dg Chest Portable 1 View  03/03/2014   CLINICAL DATA:  Heart palpitations.  Shortness of breath.  EXAM: PORTABLE CHEST - 1 VIEW  COMPARISON:  09/28/2013, 08/12/2013.  Chest CT 09/19/2013.  FINDINGS: The heart size and mediastinal contours are within normal limits. Both lungs are clear. The visualized skeletal structures are unremarkable.  IMPRESSION: No active disease.   Electronically Signed   By: Maisie Fus  Register   On: 03/03/2014 08:21     EKG Interpretation   Date/Time:  Monday March 03 2014 07:47:26 EDT Ventricular Rate:  126 PR Interval:  146 QRS Duration: 80 QT Interval:  308 QTC Calculation: 446 R Axis:   39 Text Interpretation:  Sinus tachycardia Otherwise normal ECG Confirmed by  Ruweyda Macknight MD, Duwayne Heck ([id number]) on 03/03/2014 8:57:37 AM      MDM   Final diagnoses:  Acidosis  Tachycardia  Alcohol intoxication, with unspecified complication    Patient presents today with palpitations and tachycardia  He had some chest discomfort with this which had resolved prior to my evaluation.  Patient with no pain here , nonischemic ekg, and delta troponins normal.  However, patient smells of alcohol and etoh elevated at ;60.  Patient with low co2, elevated lactic acid, and some ketones in urine.  Denies any infectious problems including skin, respiratory, gi, fever, or chills.  Patient initally tachycardiac to 130 , decreased to 105 after 2 liter bolus and lopressor iv.  Patient with history of dvt and ct angio done here to rule out pe.  Patient states he drank his usual 4-5 shots last night not drinking afte 1830 but etoh at 60 here today.    Tachycardia ddx  Volume depletion pe etoh withdrawal- patient given ativan 2 mg here, uds added.   Acidosis- elevated lactic acid- patient not hypotensive, infection not ruled out, but no leukocytosis, fever, or source of infection noted.  Will obtain blood cultures. Lactic  acidosis can also be caused by chronic alcoholism.  Patient to have ongoing hydration and observation for his acidoses and tachycardia.  Discussed with Dr.Elmahi and will transfer to [facility] for admission.  CRITICAL CARE Performed by: Hilario Quarry Total critical care time: 45 Critical care time was exclusive of separately billable procedures and treating other patients. Critical care was necessary to treat or prevent imminent or life-threatening deterioration. Critical care was time spent personally by me on the following activities: development of treatment plan with patient and/or surrogate as well as nursing, discussions with consultants, evaluation of patient's response to treatment, examination of patient, obtaining history from patient or surrogate, ordering and performing treatments and interventions, ordering and review of laboratory studies, ordering and review of radiographic studies, pulse oximetry and re-evaluation of patient's condition.     Hilario Quarry, MD 03/03/14 [phone]

## 2014-03-04 LAB — BASIC METABOLIC PANEL
Anion gap: 10 (ref 5–15)
BUN: 13 mg/dL (ref 6–23)
CHLORIDE: 104 meq/L (ref 96–112)
CO2: 24 mEq/L (ref 19–32)
Calcium: 8.1 mg/dL — ABNORMAL LOW (ref 8.4–10.5)
Creatinine, Ser: 0.84 mg/dL (ref 0.50–1.35)
GFR calc non Af Amer: >90 mL/min (ref >90)
Glucose, Bld: 104 mg/dL — ABNORMAL HIGH (ref 70–99)
Potassium: 3.5 mEq/L — ABNORMAL LOW (ref 3.7–5.3)
SODIUM: 138 meq/L (ref 137–147)

## 2014-03-04 LAB — LACTIC ACID, PLASMA: LACTIC ACID, VENOUS: 0.8 mmol/L (ref 0.5–2.2)

## 2014-03-04 MED ORDER — HYDROCODONE-ACETAMINOPHEN 5-325 MG PO TABS: 1.0000 | ORAL_TABLET | ORAL | Status: DC | PRN

## 2014-03-04 MED ORDER — POTASSIUM CHLORIDE CRYS ER 20 MEQ PO TBCR
40.0000 meq | EXTENDED_RELEASE_TABLET | Freq: Once | ORAL | Status: AC
  Administered 2014-03-04: 40 meq via ORAL
  Filled 2014-03-04: qty 2

## 2014-03-04 NOTE — Care Management Note (Signed)
    Page 1 of 1   03/04/2014     11:33:35 AM CARE MANAGEMENT NOTE 03/04/2014  Patient:  Terry Peters, Terry Peters   Account Number:  [id number]  Date Initiated:  03/04/2014  Documentation initiated by:  Lorenda Ishihara  Subjective/Objective Assessment:   53 yo male admitted with lactic acidosis/alcoholic ketosis. PTA lived at home alone.     Action/Plan:   Home when stable   Anticipated DC Date:  03/06/2014   Anticipated DC Plan:  HOME/SELF CARE      DC Planning Services  CM consult      Choice offered to / List presented to:             Status of service:  Completed, signed off Medicare Important Message given?   (If response is "NO", the following Medicare IM given date fields will be blank) Date Medicare IM given:   Medicare IM given by:   Date Additional Medicare IM given:   Additional Medicare IM given by:    Discharge Disposition:  HOME/SELF CARE  Per UR Regulation:  Reviewed for med. necessity/level of care/duration of stay  If discussed at Long Length of Stay Meetings, dates discussed:    Comments:

## 2014-03-04 NOTE — Discharge Instructions (Signed)

## 2014-03-04 NOTE — Discharge Summary (Signed)
Physician Discharge Summary  Terry Peters ZOX:[id number] DOB: June 19, 1960 DOA: 03/03/2014  PCP: PROVIDER NOT IN SYSTEM  Admit date: 03/03/2014 Discharge date: 03/04/2014  Recommendations for Outpatient Follow-up:  1. Pt will need to follow up with PCP in 2-3 weeks post discharge 2. Please obtain BMP to evaluate electrolytes and kidney function, potassium level 3. Pt given one dose of K-dur prior to discharge  4. Please also check CBC to evaluate Hg and Hct levels  Discharge Diagnoses:  Principal Problem:   Chest pain Active Problems:   Anxiety   Tachycardia   Lactic acidosis   Alcohol intoxication   Alcohol abuse   Unspecified essential hypertension  Discharge Condition: Stable  Diet recommendation: Heart healthy diet discussed in details   HPI: 53 year old male with a history of anxiety, hypertension, DVT, alcohol dependence presents with chest discomfort that woke him up from bed on the morning of admission at 5:30 AM. The patient also had associated palpitations or shortness of breath. The chest discomfort lasted approximately 10 minutes before it resolved spontaneously. The patient denied any dizziness, nausea, vomiting. He denies any fevers, chills, cough, hemoptysis. As a result of chest discomfort, the patient presented to [facility]. The patient's workup revealed a metabolic acidosis with bicarbonate 14. His lactic acid was 5.22. As a result, admission was advised. the patient denies any abdominal pain, hematochezia, melena, dysuria, hematuria. He states that he drinks 4-5 shots of liquor on a daily basis for the past 10 years. He denies illegal drug use. The patient smokes one half pack per day. He denies any other over-the-counter medications or supplements. He is presently chest pain-free. Regarding his DVT,   Assessment/Plan:  Lactic acidosis/alcoholic ketosis  -This is contributing to the patient's metabolic acidosis  -Aggressive fluid resuscitation provided and pt responded  well, requesting to go home  -The patient has no abdominal symptoms, and his abdominal exam is completely benign  -Tolerating current diet well and BMP stable except mild hypokalemia -K supplemented this AM with 40 MEQ K-Dur  alcohol dependence  -Alcohol withdrawal protocol  -Urine drug screen negative  Hypertension  -Restart home medical regimen upon discharge  Atypical chest pain  -CT angiogram of the chest negative for pulmonary embolus or aortic dissection  -Cycled troponins and all sets negative  -EKG negative for ST-T wave changes  Transaminasemia  -secondary to alcohol and fatty liver  -Cessation consultation provided  Toabacco abuse  -Tobacco cessation discussed  -Nicoderm patch provided   Discharge Exam: Filed Vitals:   03/04/14 0630  BP: 121/84  Pulse: 86  Temp: 97.8 F (36.6 C)  Resp: 18   Filed Vitals:   03/03/14 1344 03/03/14 1545 03/03/14 2127 03/04/14 0630  BP: 145/92 154/96 126/82 121/84  Pulse: 102 102 102 86  Temp: 98.6 F (37 C) 98.6 F (37 C) 98.6 F (37 C) 97.8 F (36.6 C)  TempSrc:  Oral Oral Oral  Resp:  SpO2: 100% 100% 98% 98%    General: Pt is alert, follows commands appropriately, not in acute distress Cardiovascular: Regular rate and rhythm, S1/S2 +, no murmurs, no rubs, no gallops Respiratory: Clear to auscultation bilaterally, no wheezing, no crackles, no rhonchi Abdominal: Soft, non tender, non distended, bowel sounds +, no guarding Extremities: no edema, no cyanosis, pulses palpable bilaterally DP and PT Neuro: Grossly nonfocal  Discharge Instructions  Discharge Instructions   Diet - low sodium heart healthy    Complete by:  As directed      Increase activity  slowly    Complete by:  As directed             Medication List         aspirin 81 MG chewable tablet  Chew 1 tablet (81 mg total) by mouth daily.     HYDROcodone-acetaminophen 5-325 MG per tablet  Commonly known as:  NORCO/VICODIN  Take 1-2 tablets by  mouth every 4 (four) hours as needed for moderate pain.     lisinopril-hydrochlorothiazide 20-12.5 MG per tablet  Commonly known as:  PRINZIDE,ZESTORETIC  Take 0.5 tablets by mouth daily.           Follow-up Information   Follow up with Debbora Presto, MD. (As needed call my cell phone [phone])    Specialty:  Internal Medicine   Contact information:   [address] [place] [address] [phone]        The results of significant diagnostics from this hospitalization (including imaging, microbiology, ancillary and laboratory) are listed below for reference.     Microbiology: Recent Results (from the past 240 hour(s))  CULTURE, BLOOD (ROUTINE X 2)     Status: None   Collection Time    03/03/14  1:00 PM      Result Value Ref Range Status   Specimen Description BLOOD LEFT AC   Final   Special Requests BOTTLES DRAWN AEROBIC AND ANAEROBIC 5CC EACH   Final   Culture  Setup Time     Final   Value: 03/03/2014 19:07     Performed at [facility]   Culture     Final   Value:        BLOOD CULTURE RECEIVED NO GROWTH TO DATE CULTURE WILL BE HELD FOR 5 DAYS BEFORE ISSUING A FINAL NEGATIVE REPORT     Performed at [facility]   Report Status PENDING   Incomplete  CULTURE, BLOOD (ROUTINE X 2)     Status: None   Collection Time    03/03/14  1:10 PM      Result Value Ref Range Status   Specimen Description BLOOD RIGHT HAND   Final   Special Requests BOTTLES DRAWN AEROBIC AND ANAEROBIC 5CC EACH   Final   Culture  Setup Time     Final   Value: 03/03/2014 19:09     Performed at [facility]   Culture     Final   Value:        BLOOD CULTURE RECEIVED NO GROWTH TO DATE CULTURE WILL BE HELD FOR 5 DAYS BEFORE ISSUING A FINAL NEGATIVE REPORT     Performed at [facility]   Report Status PENDING   Incomplete     Labs: Basic Metabolic Panel:  Recent Labs Lab 03/03/14 0900 03/03/14 1355 03/04/14 0500  NA 136*  --  138  K  4.6 4.4 3.5*  CL 91*  --  104  CO2 14*  --  24  GLUCOSE 74  --  104*  BUN 13  --  13  CREATININE 0.90  --  0.84  CALCIUM 9.1  --  8.1*   Liver Function Tests:  Recent Labs Lab 03/03/14 0900  AST 49*  ALT 10  ALKPHOS 72  BILITOT 0.5  PROT 8.2  ALBUMIN 4.4   No results found for this basename: LIPASE, AMYLASE,  in the last 168 hours No results found for this basename: AMMONIA,  in the last 168 hours CBC:  Recent Labs Lab 03/03/14 0815  WBC 9.3  HGB 16.5  HCT 46.9  MCV 90.7  PLT 306   Cardiac Enzymes:  Recent Labs Lab 03/03/14 0900 03/03/14 1100 03/03/14 1733 03/03/14 2246  TROPONINI <0.30 <0.30 <0.30 <0.30   BNP: BNP (last 3 results) No results found for this basename: PROBNP,  in the last 8760 hours CBG: No results found for this basename: GLUCAP,  in the last 168 hours   SIGNED: Time coordinating discharge: Over 30 minutes  Debbora Presto, MD  [facility] 03/04/2014, 10:40 AM Pager [phone]  If 7PM-7AM, please contact night-coverage [email or web address] Password [email or web address]

## 2014-03-04 NOTE — Progress Notes (Signed)
Clinical Social Work  Charity fundraiser reports patient's car is at [employer] and he is DC today. Patient reports no friends or family and is unable to get back to his car. CSW spoke with security who is unable to transport patient. CSW provided patient directions to ride [place] [facility] [employer] [place] to get back to [facility] [employer]. Patient reports he wished that someone would transport him directly but agreeable to use directions to get back to his car. RN aware of plans.  CSW is signing off but available if needed.  [place], [place] [phone]

## 2014-03-04 NOTE — Progress Notes (Signed)
Pt discharged home in stable condition. Discharge instructions and scripts given. Pt verbalized understanding 

## 2014-03-06 LAB — CULTURE, BLOOD (ROUTINE X 2)

## 2014-03-09 LAB — CULTURE, BLOOD (ROUTINE X 2): CULTURE: NO GROWTH

## 2014-09-12 DIAGNOSIS — N179 Acute kidney failure, unspecified: Secondary | ICD-10-CM

## 2014-09-12 HISTORY — DX: Acute kidney failure, unspecified: N17.9

## 2014-09-15 ENCOUNTER — Encounter (HOSPITAL_BASED_OUTPATIENT_CLINIC_OR_DEPARTMENT_OTHER): Payer: Self-pay

## 2014-09-15 ENCOUNTER — Emergency Department (HOSPITAL_BASED_OUTPATIENT_CLINIC_OR_DEPARTMENT_OTHER): Payer: Non-veteran care

## 2014-09-15 ENCOUNTER — Inpatient Hospital Stay (HOSPITAL_BASED_OUTPATIENT_CLINIC_OR_DEPARTMENT_OTHER)
Admission: EM | Admit: 2014-09-15 | Discharge: 2014-09-17 | DRG: 684 | Disposition: A | Discharge status: Home / Self Care | Payer: Non-veteran care | Attending: Internal Medicine | Admitting: Internal Medicine

## 2014-09-15 DIAGNOSIS — N179 Acute kidney failure, unspecified: Principal | ICD-10-CM | POA: Diagnosis present

## 2014-09-15 DIAGNOSIS — D500 Iron deficiency anemia secondary to blood loss (chronic): Secondary | ICD-10-CM | POA: Diagnosis present

## 2014-09-15 DIAGNOSIS — F17210 Nicotine dependence, cigarettes, uncomplicated: Secondary | ICD-10-CM | POA: Diagnosis present

## 2014-09-15 DIAGNOSIS — R748 Abnormal levels of other serum enzymes: Secondary | ICD-10-CM | POA: Diagnosis present

## 2014-09-15 DIAGNOSIS — K859 Acute pancreatitis without necrosis or infection, unspecified: Secondary | ICD-10-CM

## 2014-09-15 DIAGNOSIS — K760 Fatty (change of) liver, not elsewhere classified: Secondary | ICD-10-CM | POA: Diagnosis present

## 2014-09-15 DIAGNOSIS — E860 Dehydration: Secondary | ICD-10-CM

## 2014-09-15 DIAGNOSIS — I10 Essential (primary) hypertension: Secondary | ICD-10-CM | POA: Diagnosis present

## 2014-09-15 DIAGNOSIS — Z86718 Personal history of other venous thrombosis and embolism: Secondary | ICD-10-CM

## 2014-09-15 DIAGNOSIS — Z7982 Long term (current) use of aspirin: Secondary | ICD-10-CM | POA: Diagnosis not present

## 2014-09-15 DIAGNOSIS — R531 Weakness: Secondary | ICD-10-CM

## 2014-09-15 DIAGNOSIS — F1020 Alcohol dependence, uncomplicated: Secondary | ICD-10-CM | POA: Diagnosis present

## 2014-09-15 DIAGNOSIS — D649 Anemia, unspecified: Secondary | ICD-10-CM | POA: Diagnosis present

## 2014-09-15 DIAGNOSIS — Z79899 Other long term (current) drug therapy: Secondary | ICD-10-CM

## 2014-09-15 DIAGNOSIS — I959 Hypotension, unspecified: Secondary | ICD-10-CM | POA: Diagnosis present

## 2014-09-15 DIAGNOSIS — F419 Anxiety disorder, unspecified: Secondary | ICD-10-CM | POA: Diagnosis present

## 2014-09-15 DIAGNOSIS — D509 Iron deficiency anemia, unspecified: Secondary | ICD-10-CM | POA: Diagnosis present

## 2014-09-15 DIAGNOSIS — F1010 Alcohol abuse, uncomplicated: Secondary | ICD-10-CM | POA: Diagnosis present

## 2014-09-15 HISTORY — DX: Acute kidney failure, unspecified: N17.9

## 2014-09-15 HISTORY — DX: Anemia, unspecified: D64.9

## 2014-09-15 HISTORY — DX: Alcohol abuse, uncomplicated: F10.10

## 2014-09-15 LAB — URINALYSIS, ROUTINE W REFLEX MICROSCOPIC
BILIRUBIN URINE: NEGATIVE
Glucose, UA: NEGATIVE mg/dL
Hgb urine dipstick: NEGATIVE
Ketones, ur: NEGATIVE mg/dL
LEUKOCYTES UA: NEGATIVE
NITRITE: NEGATIVE
Protein, ur: NEGATIVE mg/dL
SPECIFIC GRAVITY, URINE: 1.009 (ref 1.005–1.030)
Urobilinogen, UA: 0.2 mg/dL (ref 0.0–1.0)
pH: 5 (ref 5.0–8.0)

## 2014-09-15 LAB — CBC WITH DIFFERENTIAL/PLATELET
BASOS ABS: 0.1 10*3/uL (ref 0.0–0.1)
Basophils Relative: 1 % (ref 0–1)
Eosinophils Absolute: 0.1 10*3/uL (ref 0.0–0.7)
Eosinophils Relative: 1 % (ref 0–5)
HEMATOCRIT: 27.7 % — AB (ref 39.0–52.0)
HEMOGLOBIN: 9.8 g/dL — AB (ref 13.0–17.0)
LYMPHS PCT: 20 % (ref 12–46)
Lymphs Abs: 1.5 10*3/uL (ref 0.7–4.0)
MCH: 32 pg (ref 26.0–34.0)
MCHC: 35.4 g/dL (ref 30.0–36.0)
MCV: 90.5 fL (ref 78.0–100.0)
Monocytes Absolute: 1.2 10*3/uL — ABNORMAL HIGH (ref 0.1–1.0)
Monocytes Relative: 16 % — ABNORMAL HIGH (ref 3–12)
NEUTROS ABS: 4.6 10*3/uL (ref 1.7–7.7)
NEUTROS PCT: 62 % (ref 43–77)
PLATELETS: 219 10*3/uL (ref 150–400)
RBC: 3.06 MIL/uL — ABNORMAL LOW (ref 4.22–5.81)
RDW: 13.3 % (ref 11.5–15.5)
WBC: 7.4 10*3/uL (ref 4.0–10.5)

## 2014-09-15 LAB — COMPREHENSIVE METABOLIC PANEL
ALT: 31 U/L (ref 0–53)
AST: 78 U/L — AB (ref 0–37)
Albumin: 4.4 g/dL (ref 3.5–5.2)
Alkaline Phosphatase: 67 U/L (ref 39–117)
Anion gap: 13 (ref 5–15)
BILIRUBIN TOTAL: 0.2 mg/dL — AB (ref 0.3–1.2)
BUN: 69 mg/dL — ABNORMAL HIGH (ref 6–23)
CHLORIDE: 93 mmol/L — AB (ref 96–112)
CO2: 24 mmol/L (ref 19–32)
Calcium: 9.8 mg/dL (ref 8.4–10.5)
Creatinine, Ser: 3.23 mg/dL — ABNORMAL HIGH (ref 0.50–1.35)
GFR calc Af Amer: 24 mL/min — ABNORMAL LOW (ref >90)
GFR, EST NON AFRICAN AMERICAN: 20 mL/min — AB (ref >90)
Glucose, Bld: 161 mg/dL — ABNORMAL HIGH (ref 70–99)
POTASSIUM: 3.2 mmol/L — AB (ref 3.5–5.1)
Sodium: 130 mmol/L — ABNORMAL LOW (ref 135–145)
Total Protein: 7.7 g/dL (ref 6.0–8.3)

## 2014-09-15 LAB — OCCULT BLOOD X 1 CARD TO LAB, STOOL: Fecal Occult Bld: NEGATIVE

## 2014-09-15 MED ORDER — SODIUM CHLORIDE 0.9 % IV BOLUS (SEPSIS)
1000.0000 mL | Freq: Once | INTRAVENOUS | Status: AC
  Administered 2014-09-15: 1000 mL via INTRAVENOUS

## 2014-09-15 NOTE — ED Notes (Signed)
Conversation with Terry Peters and decision for hospital placement to be made at midnight if no word from [facility]

## 2014-09-15 NOTE — ED Provider Notes (Signed)
CSN: [id number]     Arrival date & time 09/15/14  1603 History  This chart was scribed for Rolan BuccoMelanie Asmar Brozek, MD by Annye AsaAnna Dorsett, ED Scribe. This patient was seen in room MH07/MH07 and the patient's care was started at 6:04 PM.    Chief Complaint  Patient presents with  . Abnormal Lab   The history is provided by the patient. No language interpreter was used.     HPI Comments: Terry Peters is a 54 y.o. male with past medical history of HTN, DVT of proximal leg who presents to the Emergency Department for further evaluation of an abnormal lab result. Patient explains that he was seen by his PCP this morning and had blood work done; he received a phone call this afternoon prompting him to come to the ED "before he entered renal failure."  He notes a GI bug last week; nausea, vomiting, diarrhea and appetite decrease lasting 3-4 days before resolving without treatment. He also reports a singular episode of dark stools with this illness that resolved the next day without treatment. He feels well at present.  He does not regularly take any OTC pain medictions (no ibuprofen, no Goody powders). He denies recent medical studies. Denies history of kidney issues, recent strenuous activity or exercise regimen, urinary symptoms (no difficulty urinating, no unusual coloring to his urine).   PCP at the [facility]. Patient takes a baby aspirin once per day; he also takes lisinopril-HCTZ. He is a daily drinker, with significant EtOH intake this weekend.   Past Medical History  Diagnosis Date  . Hypertension   . Anxiety   . High blood pressure   . 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 Tooth extraction     No family history on file. History  Substance Use Topics  . Smoking status: Current Every Day Smoker -- 0.50 packs/day    Types: Cigarettes  . Smokeless tobacco: Never Used  . Alcohol Use: Yes     Comment: daily    Review of Systems  Constitutional:  Negative for fever, chills, diaphoresis and fatigue.  HENT: Negative for congestion, rhinorrhea and sneezing.   Eyes: Negative.   Respiratory: Negative for cough, chest tightness and shortness of breath.   Cardiovascular: Negative for chest pain and leg swelling.  Gastrointestinal: Negative for nausea, vomiting, abdominal pain, diarrhea and blood in stool.  Genitourinary: Negative for frequency, hematuria, flank pain and difficulty urinating.  Musculoskeletal: Negative for back pain and arthralgias.  Skin: Negative for rash.  Neurological: Negative for dizziness, speech difficulty, weakness, numbness and headaches.  All other systems reviewed and are negative.   Allergies  Peanuts and Strawberry  Home Medications   Prior to Admission medications   Medication Sig Start Date End Date Taking? Authorizing Provider  aspirin 81 MG chewable tablet Chew 1 tablet (81 mg total) by mouth daily. 08/12/13   Darlys Galesavid Masneri, MD  lisinopril-hydrochlorothiazide (PRINZIDE,ZESTORETIC) 20-12.5 MG per tablet Take 0.5 tablets by mouth daily.    Historical Provider, MD   BP 104/70 mmHg  Pulse 104  Temp(Src) 98.1 F (36.7 C) (Oral)  Resp 16  Ht 5\' 9"  (1.753 m)  Wt 173 lb (78.472 kg)  BMI 25.54 kg/m2  SpO2 100% Physical Exam  Constitutional: He is oriented to person, place, and time. He appears well-developed and well-nourished.  HENT:  Head: Normocephalic and atraumatic.  Eyes: Pupils are equal, round, and reactive to light.  Neck: Normal range of  motion. Neck supple.  Cardiovascular: Normal rate, regular rhythm and normal heart sounds.   Pulmonary/Chest: Effort normal and breath sounds normal. No respiratory distress. He has no wheezes. He has no rales. He exhibits no tenderness.  Abdominal: Soft. Bowel sounds are normal. There is no tenderness. There is no rebound and no guarding.  Musculoskeletal: Normal range of motion. He exhibits no edema.  Lymphadenopathy:    He has no cervical adenopathy.   Neurological: He is alert and oriented to person, place, and time.  Skin: Skin is warm and dry. No rash noted.  Psychiatric: He has a normal mood and affect.    ED Course  Procedures   DIAGNOSTIC STUDIES: Oxygen Saturation is 99% on RA, normal by my interpretation.    COORDINATION OF CARE: 6:13 PM Discussed treatment plan with pt at bedside and pt agreed to plan.  Results for orders placed or performed during the hospital encounter of 09/15/14  CBC with Differential  Result Value Ref Range   WBC 7.4 4.0 - 10.5 K/uL   RBC 3.06 (L) 4.22 - 5.81 MIL/uL   Hemoglobin 9.8 (L) 13.0 - 17.0 g/dL   HCT 09.8 (L) 11.9 - 14.7 %   MCV 90.5 78.0 - 100.0 fL   MCH 32.0 26.0 - 34.0 pg   MCHC 35.4 30.0 - 36.0 g/dL   RDW 82.9 56.2 - 13.0 %   Platelets 219 150 - 400 K/uL   Neutrophils Relative % 62 43 - 77 %   Neutro Abs 4.6 1.7 - 7.7 K/uL   Lymphocytes Relative 20 12 - 46 %   Lymphs Abs 1.5 0.7 - 4.0 K/uL   Monocytes Relative 16 (H) 3 - 12 %   Monocytes Absolute 1.2 (H) 0.1 - 1.0 K/uL   Eosinophils Relative 1 0 - 5 %   Eosinophils Absolute 0.1 0.0 - 0.7 K/uL   Basophils Relative 1 0 - 1 %   Basophils Absolute 0.1 0.0 - 0.1 K/uL  Comprehensive metabolic panel  Result Value Ref Range   Sodium 130 (L) 135 - 145 mmol/L   Potassium 3.2 (L) 3.5 - 5.1 mmol/L   Chloride 93 (L) 96 - 112 mmol/L   CO2 24 19 - 32 mmol/L   Glucose, Bld 161 (H) 70 - 99 mg/dL   BUN 69 (H) 6 - 23 mg/dL   Creatinine, Ser 8.65 (H) 0.50 - 1.35 mg/dL   Calcium 9.8 8.4 - 78.4 mg/dL   Total Protein 7.7 6.0 - 8.3 g/dL   Albumin 4.4 3.5 - 5.2 g/dL   AST 78 (H) 0 - 37 U/L   ALT 31 0 - 53 U/L   Alkaline Phosphatase 67 39 - 117 U/L   Total Bilirubin 0.2 (L) 0.3 - 1.2 mg/dL   GFR calc non Af Amer 20 (L) >90 mL/min   GFR calc Af Amer 24 (L) >90 mL/min   Anion gap 13 5 - 15  Urinalysis, Routine w reflex microscopic  Result Value Ref Range   Color, Urine YELLOW YELLOW   APPearance CLEAR CLEAR   Specific Gravity, Urine 1.009  1.005 - 1.030   pH 5.0 5.0 - 8.0   Glucose, UA NEGATIVE NEGATIVE mg/dL   Hgb urine dipstick NEGATIVE NEGATIVE   Bilirubin Urine NEGATIVE NEGATIVE   Ketones, ur NEGATIVE NEGATIVE mg/dL   Protein, ur NEGATIVE NEGATIVE mg/dL   Urobilinogen, UA 0.2 0.0 - 1.0 mg/dL   Nitrite NEGATIVE NEGATIVE   Leukocytes, UA NEGATIVE NEGATIVE  Occult blood card to lab,  stool  Result Value Ref Range   Fecal Occult Bld NEGATIVE NEGATIVE   US Renal  09/15/2014   CLINICAL DATA:  Patient with abnormal BUN and creatinine. History of hypertension.  EXAM: RENAL/URINARY TRACT ULTRASOUND COMPLETE  COMPARISON:  None.  FINDINGS: Right Kidney:  Length: 13.3 cm. Echogenicity within normal limits. No mass or hydronephrosis visualized.  Left Kidney:  Length: 11.1 cm. Echogenicity within normal limits. No mass or hydronephrosis visualized.  Bladder:  Appears normal for degree of bladder distention.  Visualized aspect of the liver is diffusely increased in echogenicity.  IMPRESSION: No hydronephrosis.  Increased echogenicity of the visualized aspect of the liver, raising the possibility of hepatic steatosis.   Electronically Signed   By: Annia Belt M.D.   On: 09/15/2014 20:11    Labs Review Labs Reviewed  CBC WITH DIFFERENTIAL/PLATELET - Abnormal; Notable for the following:    RBC 3.06 (*)    Hemoglobin 9.8 (*)    HCT 27.7 (*)    Monocytes Relative 16 (*)    Monocytes Absolute 1.2 (*)    All other components within normal limits  COMPREHENSIVE METABOLIC PANEL - Abnormal; Notable for the following:    Sodium 130 (*)    Potassium 3.2 (*)    Chloride 93 (*)    Glucose, Bld 161 (*)    BUN 69 (*)    Creatinine, Ser 3.23 (*)    AST 78 (*)    Total Bilirubin 0.2 (*)    GFR calc non Af Amer 20 (*)    GFR calc Af Amer 24 (*)    All other components within normal limits  URINALYSIS, ROUTINE W REFLEX MICROSCOPIC  OCCULT BLOOD X 1 CARD TO LAB, STOOL  POC OCCULT BLOOD, ED    Imaging Review US Renal  09/15/2014   CLINICAL  DATA:  Patient with abnormal BUN and creatinine. History of hypertension.  EXAM: RENAL/URINARY TRACT ULTRASOUND COMPLETE  COMPARISON:  None.  FINDINGS: Right Kidney:  Length: 13.3 cm. Echogenicity within normal limits. No mass or hydronephrosis visualized.  Left Kidney:  Length: 11.1 cm. Echogenicity within normal limits. No mass or hydronephrosis visualized.  Bladder:  Appears normal for degree of bladder distention.  Visualized aspect of the liver is diffusely increased in echogenicity.  IMPRESSION: No hydronephrosis.  Increased echogenicity of the visualized aspect of the liver, raising the possibility of hepatic steatosis.   Electronically Signed   By: Annia Belt M.D.   On: 09/15/2014 20:11     EKG Interpretation   Date/Time:  Monday September 15 2014 16:25:48 EDT Ventricular Rate:  121 PR Interval:  154 QRS Duration: 90 QT Interval:  306 QTC Calculation: 434 R Axis:   46 Text Interpretation:  Sinus tachycardia Otherwise normal ECG since last  tracing no significant change Confirmed by Cassia Fein  MD, Seerat Peaden ([id number]) on  09/15/2014 5:10:59 PM      MDM   Final diagnoses:  Acute kidney injury  Iron deficiency anemia   Patient presents with an elevated creatinine. This is markedly elevated as compared to his baseline values which were normal in September. He was given IV fluids in the ED. His urine does not appear infected. He has no evidence of hydronephrosis on renal ultrasound. He does have anemia. He does report one evidence of melena last week. His Hemoccult is negative today. I spoke with the hospitalist who is accepted patient for transfer to [facility].  I personally performed the services described in this documentation, which was scribed in  my presence.  The recorded information has been reviewed and considered.       Rolan Bucco, MD 09/16/14 [id number]

## 2014-09-15 NOTE — ED Notes (Signed)
Pt states he was sen by PCP today for c/o  Lightheaded  for 2-3 days-was later called and advised to come to ED "before i go in to renal failure"

## 2014-09-16 ENCOUNTER — Inpatient Hospital Stay (HOSPITAL_COMMUNITY): Payer: Non-veteran care

## 2014-09-16 ENCOUNTER — Encounter (HOSPITAL_COMMUNITY): Payer: Self-pay | Admitting: Internal Medicine

## 2014-09-16 DIAGNOSIS — D649 Anemia, unspecified: Secondary | ICD-10-CM | POA: Diagnosis present

## 2014-09-16 DIAGNOSIS — N179 Acute kidney failure, unspecified: Principal | ICD-10-CM

## 2014-09-16 DIAGNOSIS — I959 Hypotension, unspecified: Secondary | ICD-10-CM | POA: Diagnosis present

## 2014-09-16 DIAGNOSIS — E860 Dehydration: Secondary | ICD-10-CM

## 2014-09-16 DIAGNOSIS — F1010 Alcohol abuse, uncomplicated: Secondary | ICD-10-CM

## 2014-09-16 LAB — HEPATIC FUNCTION PANEL
ALT: 27 U/L (ref 0–53)
AST: 62 U/L — ABNORMAL HIGH (ref 0–37)
Albumin: 3.4 g/dL — ABNORMAL LOW (ref 3.5–5.2)
Alkaline Phosphatase: 61 U/L (ref 39–117)
BILIRUBIN DIRECT: 0.1 mg/dL (ref 0.0–0.5)
BILIRUBIN INDIRECT: 0.4 mg/dL (ref 0.3–0.9)
Total Bilirubin: 0.5 mg/dL (ref 0.3–1.2)
Total Protein: 6.2 g/dL (ref 6.0–8.3)

## 2014-09-16 LAB — CBC WITH DIFFERENTIAL/PLATELET
BASOS PCT: 1 % (ref 0–1)
Basophils Absolute: 0 10*3/uL (ref 0.0–0.1)
Eosinophils Absolute: 0.1 10*3/uL (ref 0.0–0.7)
Eosinophils Relative: 1 % (ref 0–5)
HEMATOCRIT: 26.9 % — AB (ref 39.0–52.0)
HEMOGLOBIN: 9 g/dL — AB (ref 13.0–17.0)
LYMPHS PCT: 26 % (ref 12–46)
Lymphs Abs: 1.6 10*3/uL (ref 0.7–4.0)
MCH: 30.8 pg (ref 26.0–34.0)
MCHC: 33.5 g/dL (ref 30.0–36.0)
MCV: 92.1 fL (ref 78.0–100.0)
MONO ABS: 0.8 10*3/uL (ref 0.1–1.0)
Monocytes Relative: 13 % — ABNORMAL HIGH (ref 3–12)
NEUTROS ABS: 3.8 10*3/uL (ref 1.7–7.7)
NEUTROS PCT: 59 % (ref 43–77)
Platelets: 232 10*3/uL (ref 150–400)
RBC: 2.92 MIL/uL — ABNORMAL LOW (ref 4.22–5.81)
RDW: 14.5 % (ref 11.5–15.5)
WBC: 6.2 10*3/uL (ref 4.0–10.5)

## 2014-09-16 LAB — PROCALCITONIN: PROCALCITONIN: 0.12 ng/mL

## 2014-09-16 LAB — BASIC METABOLIC PANEL
Anion gap: 6 (ref 5–15)
BUN: 49 mg/dL — AB (ref 6–23)
CHLORIDE: 103 mmol/L (ref 96–112)
CO2: 22 mmol/L (ref 19–32)
CREATININE: 2.21 mg/dL — AB (ref 0.50–1.35)
Calcium: 9 mg/dL (ref 8.4–10.5)
GFR calc Af Amer: 37 mL/min — ABNORMAL LOW (ref >90)
GFR calc non Af Amer: 32 mL/min — ABNORMAL LOW (ref >90)
Glucose, Bld: 157 mg/dL — ABNORMAL HIGH (ref 70–99)
Potassium: 3.2 mmol/L — ABNORMAL LOW (ref 3.5–5.1)
SODIUM: 131 mmol/L — AB (ref 135–145)

## 2014-09-16 LAB — CORTISOL: Cortisol, Plasma: 17.8 ug/dL

## 2014-09-16 LAB — FOLATE: Folate: 8.1 ng/mL

## 2014-09-16 LAB — TSH: TSH: 2.527 u[IU]/mL (ref 0.350–4.500)

## 2014-09-16 LAB — IRON AND TIBC
Iron: 79 ug/dL (ref 42–165)
Saturation Ratios: 37 % (ref 20–55)
TIBC: 215 ug/dL (ref 215–435)
UIBC: 136 ug/dL (ref 125–400)

## 2014-09-16 LAB — TROPONIN I: Troponin I: <0.03 ng/mL (ref <0.031)

## 2014-09-16 LAB — TYPE AND SCREEN
ABO/RH(D): A POS
Antibody Screen: NEGATIVE

## 2014-09-16 LAB — VITAMIN B12: Vitamin B-12: 783 pg/mL (ref 211–911)

## 2014-09-16 LAB — LACTIC ACID, PLASMA: LACTIC ACID, VENOUS: 1.8 mmol/L (ref 0.5–2.0)

## 2014-09-16 LAB — ABO/RH: ABO/RH(D): A POS

## 2014-09-16 LAB — RETICULOCYTES
RBC.: 2.92 MIL/uL — AB (ref 4.22–5.81)
Retic Count, Absolute: 46.7 10*3/uL (ref 19.0–186.0)
Retic Ct Pct: 1.6 % (ref 0.4–3.1)

## 2014-09-16 LAB — LIPASE, BLOOD: Lipase: 298 U/L — ABNORMAL HIGH (ref 11–59)

## 2014-09-16 LAB — LACTATE DEHYDROGENASE: LDH: 189 U/L (ref 94–250)

## 2014-09-16 LAB — FERRITIN: FERRITIN: 2300 ng/mL — AB (ref 22–322)

## 2014-09-16 LAB — D-DIMER, QUANTITATIVE: D-Dimer, Quant: 0.32 ug/mL-FEU (ref 0.00–0.48)

## 2014-09-16 MED ORDER — ONDANSETRON HCL 4 MG PO TABS: 4.0000 mg | ORAL_TABLET | Freq: Four times a day (QID) | ORAL | Status: DC | PRN

## 2014-09-16 MED ORDER — VITAMIN B-1 100 MG PO TABS
100.0000 mg | ORAL_TABLET | Freq: Every day | ORAL | Status: DC
  Administered 2014-09-16 – 2014-09-17 (×2): 100 mg via ORAL
  Filled 2014-09-16 (×2): qty 1

## 2014-09-16 MED ORDER — ACETAMINOPHEN 325 MG PO TABS: 650.0000 mg | ORAL_TABLET | Freq: Four times a day (QID) | ORAL | Status: DC | PRN

## 2014-09-16 MED ORDER — ACETAMINOPHEN 650 MG RE SUPP: 650.0000 mg | Freq: Four times a day (QID) | RECTAL | Status: DC | PRN

## 2014-09-16 MED ORDER — POTASSIUM CHLORIDE CRYS ER 20 MEQ PO TBCR
40.0000 meq | EXTENDED_RELEASE_TABLET | Freq: Once | ORAL | Status: AC
  Administered 2014-09-16: 40 meq via ORAL
  Filled 2014-09-16: qty 2

## 2014-09-16 MED ORDER — SODIUM CHLORIDE 0.9 % IV SOLN
INTRAVENOUS | Status: AC
  Administered 2014-09-16 (×2): via INTRAVENOUS

## 2014-09-16 MED ORDER — SODIUM CHLORIDE 0.9 % IV SOLN
INTRAVENOUS | Status: DC
  Administered 2014-09-16: 03:00:00 via INTRAVENOUS

## 2014-09-16 MED ORDER — ONDANSETRON HCL 4 MG/2ML IJ SOLN: 4.0000 mg | Freq: Four times a day (QID) | INTRAMUSCULAR | Status: DC | PRN

## 2014-09-16 MED ORDER — THIAMINE HCL 100 MG/ML IJ SOLN
100.0000 mg | Freq: Every day | INTRAMUSCULAR | Status: DC
Start: 2014-09-16 — End: 2014-09-16
  Filled 2014-09-16: qty 1

## 2014-09-16 MED ORDER — LORAZEPAM 1 MG PO TABS: 1.0000 mg | ORAL_TABLET | Freq: Four times a day (QID) | ORAL | Status: DC | PRN

## 2014-09-16 MED ORDER — FOLIC ACID 1 MG PO TABS
1.0000 mg | ORAL_TABLET | Freq: Every day | ORAL | Status: DC
  Administered 2014-09-16 – 2014-09-17 (×2): 1 mg via ORAL
  Filled 2014-09-16 (×2): qty 1

## 2014-09-16 MED ORDER — LORAZEPAM 2 MG/ML IJ SOLN: 1.0000 mg | Freq: Four times a day (QID) | INTRAMUSCULAR | Status: DC | PRN

## 2014-09-16 MED ORDER — LORAZEPAM 1 MG PO TABS: 0.0000 mg | ORAL_TABLET | Freq: Four times a day (QID) | ORAL | Status: DC

## 2014-09-16 MED ORDER — LORAZEPAM 1 MG PO TABS: 0.0000 mg | ORAL_TABLET | Freq: Two times a day (BID) | ORAL | Status: DC

## 2014-09-16 MED ORDER — SODIUM CHLORIDE 0.9 % IV BOLUS (SEPSIS)
1000.0000 mL | Freq: Once | INTRAVENOUS | Status: AC
  Administered 2014-09-16: 1000 mL via INTRAVENOUS

## 2014-09-16 MED ORDER — ADULT MULTIVITAMIN W/MINERALS CH
1.0000 | ORAL_TABLET | Freq: Every day | ORAL | Status: DC
  Administered 2014-09-16 – 2014-09-17 (×2): 1 via ORAL
  Filled 2014-09-16 (×2): qty 1

## 2014-09-16 NOTE — H&P (Addendum)
[facility] History and Physical  Terry CutterFrank Tuch NWG:[id number] DOB: [birth year] DOA: 09/15/2014  Referring physician: Patient was transferred from [facility] [place]. PCP: PROVIDER NOT IN SYSTEM  Chief Complaint: Weakness and dizziness.  HPI: Terry Peters is a 54 y.o. male with history of hypertension and alcohol abuse presents to the ER because of weakness and dizziness. Patient states a week ago he had nausea vomiting and diarrhea which lasted for 3 days following which patient was feeling weak and dizzy. He had not taken his antihypertensives because of his weakness. He had gone to [place] on the weekend driving and came back on Sunday 2 days ago. Since he was feeling weak he came to the ER and his lab work show acute renal failure with anemia which is new. Stool for occult blood was negative. Patient denies any blood in the stools or vomitus. Patient states he drinks alcohol every night. Patient denies any chest pain fever chills shortness of breath abdominal pain at this time. On my exam in the hospital patient was found to be hypotensive but not tachycardic. Patient did receive 1 L bolus in the ER. And I have ordered one more liter bolus following which patient blood pressure at this time is improving.   Review of Systems: As presented in the history of presenting illness, rest negative.  Past Medical History  Diagnosis Date  . Hypertension   . Anxiety   . High blood pressure   . 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 Tooth extraction     Social History:  reports that he has been smoking Cigarettes.  He has been smoking about 0.50 packs per day. He has never used smokeless tobacco. He reports that he drinks alcohol. He reports that he does not use illicit drugs. Where does patient live home. Can patient participate in ADLs? Yes.  Allergies  Allergen Reactions  . Peanuts [Peanut Oil]   . Strawberry     Family History:   Family History  Problem Relation Age of Onset  . Hypertension Mother       Prior to Admission medications   Medication Sig Start Date End Date Taking? Authorizing Provider  aspirin 81 MG chewable tablet Chew 1 tablet (81 mg total) by mouth daily. 08/12/13   Darlys Galesavid Masneri, MD  lisinopril-hydrochlorothiazide (PRINZIDE,ZESTORETIC) 20-12.5 MG per tablet Take 0.5 tablets by mouth daily.    Historical Provider, MD    Physical Exam: Filed Vitals:   09/15/14 2149 09/15/14 2334 09/16/14 0231 09/16/14 0312  BP: 112/77 104/70 98/68 108/81  Pulse: 99 104 90 98  Temp:  98.1 F (36.7 C) 98.3 F (36.8 C)   TempSrc:   Oral   Resp: 16 16 16    Height:      Weight:      SpO2: 99% 100% 100%      General:  Well built and nourished.  Eyes: Anicteric no pallor.  ENT: No discharge from the ears eyes nose and mouth.  Neck: No mass felt.  Cardiovascular: S1-S2 heard.  Respiratory: No rhonchi or crepitations.  Abdomen: Soft nontender bowel sounds present.  Skin: No rash.  Musculoskeletal: No edema.  Psychiatric: Appears normal.  Neurologic: Alert awake oriented to time place and person. Moves all extremities.  Labs on Admission:  Basic Metabolic Panel:  Recent Labs Lab 09/15/14 1435  NA 130*  K 3.2*  CL 93*  CO2 24  GLUCOSE 161*  BUN 69*  CREATININE 3.23*  CALCIUM 9.8   Liver Function Tests:  Recent Labs Lab 09/15/14 1435  AST 78*  ALT 31  ALKPHOS 67  BILITOT 0.2*  PROT 7.7  ALBUMIN 4.4   No results for input(s): LIPASE, AMYLASE in the last 168 hours. No results for input(s): AMMONIA in the last 168 hours. CBC:  Recent Labs Lab 09/15/14 1435  WBC 7.4  NEUTROABS 4.6  HGB 9.8*  HCT 27.7*  MCV 90.5  PLT 219   Cardiac Enzymes: No results for input(s): CKTOTAL, CKMB, CKMBINDEX, TROPONINI in the last 168 hours.  BNP (last 3 results) No results for input(s): BNP in the last 8760 hours.  ProBNP (last 3 results) No results for input(s): PROBNP in the last  8760 hours.  CBG: No results for input(s): GLUCAP in the last 168 hours.  Radiological Exams on Admission: US Renal  09/15/2014   CLINICAL DATA:  Patient with abnormal BUN and creatinine. History of hypertension.  EXAM: RENAL/URINARY TRACT ULTRASOUND COMPLETE  COMPARISON:  None.  FINDINGS: Right Kidney:  Length: 13.3 cm. Echogenicity within normal limits. No mass or hydronephrosis visualized.  Left Kidney:  Length: 11.1 cm. Echogenicity within normal limits. No mass or hydronephrosis visualized.  Bladder:  Appears normal for degree of bladder distention.  Visualized aspect of the liver is diffusely increased in echogenicity.  IMPRESSION: No hydronephrosis.  Increased echogenicity of the visualized aspect of the liver, raising the possibility of hepatic steatosis.   Electronically Signed   By: Annia Belt M.D.   On: 09/15/2014 20:11    EKG: Independently reviewed. Sinus tachycardia.  Assessment/Plan Principal Problem:   Hypotension Active Problems:   Alcohol abuse   Acute kidney injury   Normocytic normochromic anemia   1. Hypotension - cause not known most likely secondary to dehydration. Renal sonogram does not show any obstruction. And UA is unremarkable. We will hold off patient's antihypertensives and continue with hydration. Check lactic acid levels procalcitonin d-dimer troponin and continue hydration. Check drug screen. 2. Anemia - normocytic and normochromic. Patient has significant fall in hemoglobin since his last hemoglobin check in September last year. Stool for occult blood was negative. I have ordered repeat CBC and type and screen. Check LDH for any hemolytic process. Patient denies taking any NSAIDs. Check an anemia panel. 3. History of hypertension presently hypotensive hold antihypertensives. 4. Alcohol abuse and tobacco abuse - patient advised to quit these habits. Patient is on alcohol withdrawal protocol.   DVT Prophylaxis SCDs.  Code Status: Full code.  Family  Communication: None.  Disposition Plan: Admit to inpatient.    Carrye Goller N. [facility] Pager [phone].  If 7PM-7AM, please contact night-coverage [email or web address] Password [facility] 09/16/2014, 3:33 AM

## 2014-09-16 NOTE — Progress Notes (Signed)
PROGRESS NOTE  Terry Peters ZOX:[id number] DOB: [birth year] DOA: 09/15/2014 PCP: PROVIDER NOT IN SYSTEM  Brief history 54 year old male with a history of anxiety, hypertension, DVT, alcohol dependence presents with 2-3 day history of generalized weakness and dizziness. A little over 1 week prior to this admission, the patient had 3 days of nausea, vomiting, and diarrhea after which he felt generalized weakness and dizziness. For those days, the patient has had decreased oral intake. In fact, he has not taken his antihypertensive medications for over 1 week. He denied any fevers, chills, chest pain, shortness breath, abdominal pain, dysuria, hematuria, hematochezia, melena. There is no coughing or hemoptysis. In the emergency department, the patient was found to have acute renal failure with a serum creatinine of 3.23. In addition, his hemoglobin was 9.8. His hemoglobin was 16.5 on 03/03/2014. In the emergency department, the patient was also hypotensive which improved with fluid resuscitation. Assessment/Plan: Hypotension -Improved with fluid resuscitation although blood pressure remains soft -Hold lisinopril/HCTZ -Blood cultures negative to date -Due to volume depletion Acute kidney injury -Secondary to volume depletion in setting of ACEi and diuretic -Improving with fluid resuscitation -Renal ultrasound negative for hydronephrosis Elevated Lipase -clinically not acting like acute pancreatitis -no abd pain -CT abdomen/pelvis withOUT IV contrast Chronic blood loss anemia -Hemoglobin 16.5 on 03/04/2015 -Hemoccult stool negative -Iron saturation 37%, serum B12 and folate unremarkable -LDH 189, haptoglobin pending -repeat hemoccult x 1 -denies NSAIDS -CT abd/pelvis r/o retroperitoneal bleed Alcohol dependence -CIWA -pt's N/V may have been related to Etoh-->pancreatitis Transaminasemia -fatty liver and Etoh -HIV antibody -Hepatitis B surface antigen -Hep C antibody   Family  Communication:   Pt at beside Disposition Plan:   Home when medically stable       Procedures/Studies: US Renal  09/15/2014   CLINICAL DATA:  Patient with abnormal BUN and creatinine. History of hypertension.  EXAM: RENAL/URINARY TRACT ULTRASOUND COMPLETE  COMPARISON:  None.  FINDINGS: Right Kidney:  Length: 13.3 cm. Echogenicity within normal limits. No mass or hydronephrosis visualized.  Left Kidney:  Length: 11.1 cm. Echogenicity within normal limits. No mass or hydronephrosis visualized.  Bladder:  Appears normal for degree of bladder distention.  Visualized aspect of the liver is diffusely increased in echogenicity.  IMPRESSION: No hydronephrosis.  Increased echogenicity of the visualized aspect of the liver, raising the possibility of hepatic steatosis.   Electronically Signed   By: Annia Belt M.D.   On: 09/15/2014 20:11   Dg Abd Acute W/chest  09/16/2014   CLINICAL DATA:  Weakness  EXAM: ACUTE ABDOMEN SERIES (ABDOMEN 2 VIEW & CHEST 1 VIEW)  COMPARISON:  03/03/2014  FINDINGS: Cardiac shadow is within normal limits. The lungs are well aerated bilaterally. No focal infiltrate or sizable effusion is seen.  Scattered large and small bowel gas is noted. Mild fecal material is noted within the colon. No abnormal mass or abnormal calcifications are seen.  IMPRESSION: Negative abdominal radiographs.  No acute cardiopulmonary disease.   Electronically Signed   By: Alcide Clever M.D.   On: 09/16/2014 10:45         Subjective: Patient is feeling better today. He denies any fevers, chills, chest pain, shortness breath, nausea, vomiting, diarrhea, abdominal pain, dysuria, hematuria. No hematochezia or melena. No hemoptysis.  Objective: Filed Vitals:   09/16/14 0456 09/16/14 0820 09/16/14 1220 09/16/14 1329  BP: 106/70 103/73 103/72 110/73  Pulse:  97 94 105  Temp:    98.2 F (36.8 C)  TempSrc:      Resp:    18  Height:      Weight:      SpO2:    100%    Intake/Output Summary (Last 24  hours) at 09/16/14 2023 Last data filed at 09/16/14 1844  Gross per 24 hour  Intake 1584.58 ml  Output   1125 ml  Net 459.58 ml   Weight change:  Exam:   General:  Pt is alert, follows commands appropriately, not in acute distress  HEENT: No icterus, No thrush, No neck mass, [place]/AT  Cardiovascular: RRR, S1/S2, no rubs, no gallops  Respiratory: CTA bilaterally, no wheezing, no crackles, no rhonchi  Abdomen: Soft/+BS, non tender, non distended, no guarding  Extremities: No edema, No lymphangitis, No petechiae, No rashes, no synovitis  Data Reviewed: Basic Metabolic Panel:  Recent Labs Lab 09/15/14 1435 09/16/14 0517  NA 130* 131*  K 3.2* 3.2*  CL 93* 103  CO2 24 22  GLUCOSE 161* 157*  BUN 69* 49*  CREATININE 3.23* 2.21*  CALCIUM 9.8 9.0   Liver Function Tests:  Recent Labs Lab 09/15/14 1435 09/16/14 0517  AST 78* 62*  ALT 31 27  ALKPHOS 67 61  BILITOT 0.2* 0.5  PROT 7.7 6.2  ALBUMIN 4.4 3.4*    Recent Labs Lab 09/16/14 0517  LIPASE 298*   No results for input(s): AMMONIA in the last 168 hours. CBC:  Recent Labs Lab 09/15/14 1435 09/16/14 0517  WBC 7.4 6.2  NEUTROABS 4.6 3.8  HGB 9.8* 9.0*  HCT 27.7* 26.9*  MCV 90.5 92.1  PLT 219 232   Cardiac Enzymes:  Recent Labs Lab 09/16/14 0517  TROPONINI <0.03   BNP: Invalid input(s): POCBNP CBG: No results for input(s): GLUCAP in the last 168 hours.  No results found for this or any previous visit (from the past 240 hour(s)).   Scheduled Meds: . folic acid  1 mg Oral Daily  . LORazepam  0-4 mg Oral 4 times per day   Followed by  . [START ON 09/18/2014] LORazepam  0-4 mg Oral Q12H  . multivitamin with minerals  1 tablet Oral Daily  . thiamine  100 mg Oral Daily   Continuous Infusions: . sodium chloride 125 mL/hr at 09/16/14 0353     Girlie Veltri, DO  [facility] Pager [phone]  If 7PM-7AM, please contact night-coverage [email or web address] Password [email or web address] 09/16/2014, 8:23 PM   LOS:  1 day

## 2014-09-17 ENCOUNTER — Inpatient Hospital Stay (HOSPITAL_COMMUNITY): Payer: Non-veteran care

## 2014-09-17 DIAGNOSIS — E860 Dehydration: Secondary | ICD-10-CM

## 2014-09-17 LAB — CBC
HCT: 23.5 % — ABNORMAL LOW (ref 39.0–52.0)
HEMOGLOBIN: 7.9 g/dL — AB (ref 13.0–17.0)
MCH: 31.3 pg (ref 26.0–34.0)
MCHC: 33.6 g/dL (ref 30.0–36.0)
MCV: 93.3 fL (ref 78.0–100.0)
PLATELETS: 257 10*3/uL (ref 150–400)
RBC: 2.52 MIL/uL — ABNORMAL LOW (ref 4.22–5.81)
RDW: 14.7 % (ref 11.5–15.5)
WBC: 5.9 10*3/uL (ref 4.0–10.5)

## 2014-09-17 LAB — COMPREHENSIVE METABOLIC PANEL
ALT: 20 U/L (ref 0–53)
ANION GAP: 9 (ref 5–15)
AST: 42 U/L — ABNORMAL HIGH (ref 0–37)
Albumin: 2.9 g/dL — ABNORMAL LOW (ref 3.5–5.2)
Alkaline Phosphatase: 53 U/L (ref 39–117)
BUN: 25 mg/dL — AB (ref 6–23)
CALCIUM: 8.4 mg/dL (ref 8.4–10.5)
CO2: 21 mmol/L (ref 19–32)
CREATININE: 1.35 mg/dL (ref 0.50–1.35)
Chloride: 109 mmol/L (ref 96–112)
GFR, EST AFRICAN AMERICAN: 68 mL/min — AB (ref >90)
GFR, EST NON AFRICAN AMERICAN: 58 mL/min — AB (ref >90)
Glucose, Bld: 116 mg/dL — ABNORMAL HIGH (ref 70–99)
Potassium: 3.7 mmol/L (ref 3.5–5.1)
SODIUM: 139 mmol/L (ref 135–145)
TOTAL PROTEIN: 5.4 g/dL — AB (ref 6.0–8.3)
Total Bilirubin: 0.6 mg/dL (ref 0.3–1.2)

## 2014-09-17 LAB — HEPATITIS C ANTIBODY: HCV Ab: NEGATIVE

## 2014-09-17 LAB — LIPASE, BLOOD: Lipase: 193 U/L — ABNORMAL HIGH (ref 11–59)

## 2014-09-17 LAB — HIV ANTIBODY (ROUTINE TESTING W REFLEX): HIV SCREEN 4TH GENERATION: NONREACTIVE

## 2014-09-17 LAB — HEPATITIS B SURFACE ANTIGEN: HEP B S AG: NEGATIVE

## 2014-09-17 MED ORDER — THIAMINE HCL 100 MG PO TABS: 100.0000 mg | ORAL_TABLET | Freq: Every day | ORAL | Status: AC

## 2014-09-17 MED ORDER — IOHEXOL 300 MG/ML  SOLN
25.0000 mL | INTRAMUSCULAR | Status: AC
  Administered 2014-09-17 (×2): 25 mL via ORAL

## 2014-09-17 MED ORDER — FOLIC ACID 1 MG PO TABS: 1.0000 mg | ORAL_TABLET | Freq: Every day | ORAL | Status: AC

## 2014-09-17 MED ORDER — ADULT MULTIVITAMIN W/MINERALS CH: 1.0000 | ORAL_TABLET | Freq: Every day | ORAL | Status: DC

## 2014-09-17 NOTE — Discharge Instructions (Signed)
Dehydration, Adult Dehydration is when you lose more fluids from the body than you take in. Vital organs like the kidneys, brain, and heart cannot function without a proper amount of fluids and salt. Any loss of fluids from the body can cause dehydration.  CAUSES   Vomiting.  Diarrhea.  Excessive sweating.  Excessive urine output.  Fever. SYMPTOMS  Mild dehydration  Thirst.  Dry lips.  Slightly dry mouth. Moderate dehydration  Very dry mouth.  Sunken eyes.  Skin does not bounce back quickly when lightly pinched and released.  Dark urine and decreased urine production.  Decreased tear production.  Headache. Severe dehydration  Very dry mouth.  Extreme thirst.  Rapid, weak pulse (more than 100 beats per minute at rest).  Cold hands and feet.  Not able to sweat in spite of heat and temperature.  Rapid breathing.  Blue lips.  Confusion and lethargy.  Difficulty being awakened.  Minimal urine production.  No tears. DIAGNOSIS  Your caregiver will diagnose dehydration based on your symptoms and your exam. Blood and urine tests will help confirm the diagnosis. The diagnostic evaluation should also identify the cause of dehydration. TREATMENT  Treatment of mild or moderate dehydration can often be done at home by increasing the amount of fluids that you drink. It is best to drink small amounts of fluid more often. Drinking too much at one time can make vomiting worse. Refer to the home care instructions below. Severe dehydration needs to be treated at the hospital where you will probably be given intravenous (IV) fluids that contain water and electrolytes. HOME CARE INSTRUCTIONS   Ask your caregiver about specific rehydration instructions.  Drink enough fluids to keep your urine clear or pale yellow.  Drink small amounts frequently if you have nausea and vomiting.  Eat as you normally do.  Avoid:  Foods or drinks high in sugar.  Carbonated  drinks.  Juice.  Extremely hot or cold fluids.  Drinks with caffeine.  Fatty, greasy foods.  Alcohol.  Tobacco.  Overeating.  Gelatin desserts.  Wash your hands well to avoid spreading bacteria and viruses.  Only take over-the-counter or prescription medicines for pain, discomfort, or fever as directed by your caregiver.  Ask your caregiver if you should continue all prescribed and over-the-counter medicines.  Keep all follow-up appointments with your caregiver. SEEK MEDICAL CARE IF:  You have abdominal pain and it increases or stays in one area (localizes).  You have a rash, stiff neck, or severe headache.  You are irritable, sleepy, or difficult to awaken.  You are weak, dizzy, or extremely thirsty. SEEK IMMEDIATE MEDICAL CARE IF:   You are unable to keep fluids down or you get worse despite treatment.  You have frequent episodes of vomiting or diarrhea.  You have blood or green matter (bile) in your vomit.  You have blood in your stool or your stool looks black and tarry.  You have not urinated in 6 to 8 hours, or you have only urinated a small amount of very dark urine.  You have a fever.  You faint. MAKE SURE YOU:   Understand these instructions.  Will watch your condition.  Will get help right away if you are not doing well or get worse. Document Released: 05/30/2005 Document Revised: 08/22/2011 Document Reviewed: 01/17/2011 ExitCare Patient Information 2015 [employer]. This information is not intended to replace advice given to you by your health care provider. Make sure you discuss any questions you have with your health care   provider.  

## 2014-09-17 NOTE — Discharge Summary (Addendum)
Physician Discharge Summary  Terry Peters EAV:[id number] DOB: Sep 08, 1960 DOA: 09/15/2014  PCP: PROVIDER NOT IN SYSTEM  Admit date: 09/15/2014 Discharge date: 09/17/2014  Time spent: 45 minutes  Recommendations for Outpatient Follow-up:  Patient will be discharged to home.  He will need to follow-up with his primary care physician at the [facility].  He should have repeat CBC within 1 week of discharge. Patient was strongly urged to abstain from alcohol use.  He should continue a heart healthy diet.  Patient should continue his medications as prescribed.   Discharge Diagnoses:  Hypertension Acute kidney injury Elevated lipase Chronic blood loss anemia Alcohol dependence Transaminasemia  Discharge Condition: Stable  Diet recommendation: Heart healthy  Filed Weights   09/15/14 1617 09/16/14 0354 09/17/14 0541  Weight: 78.472 kg (173 lb) 81.7 kg (180 lb 1.9 oz) 84.5 kg (186 lb 4.6 oz)    History of present illness:  On 09/16/2014 by Dr. Midge Minium Clary Peters is a 54 y.o. male with history of hypertension and alcohol abuse presents to the ER because of weakness and dizziness. Patient states a week ago he had nausea vomiting and diarrhea which lasted for 3 days following which patient was feeling weak and dizzy. He had not taken his antihypertensives because of his weakness. He had gone to [place] on the weekend driving and came back on Sunday 2 days ago. Since he was feeling weak he came to the ER and his lab work show acute renal failure with anemia which is new. Stool for occult blood was negative. Patient denies any blood in the stools or vomitus. Patient states he drinks alcohol every night. Patient denies any chest pain fever chills shortness of breath abdominal pain at this time. On my exam in the hospital patient was found to be hypotensive but not tachycardic. Patient did receive 1 L bolus in the ER. And I have ordered one more liter bolus following which patient blood pressure at  this time is improving.   Hospital Course:  Hypotension -Improved with fluid resuscitation  -Hold lisinopril/HCTZ, should speak with his PCP regarding restarting this medication -Blood cultures negative to date -Due to volume depletion  Acute kidney injury -Secondary to volume depletion in setting of ACEi and diuretic -Improving with fluid resuscitation -Renal ultrasound negative for hydronephrosis -Cr was 3.23 upon admission, currently 1.35  Elevated Lipase -improving, clinically not acting like acute pancreatitis -no abd pain -CT abdomen/pelvis withOUT IV contrast: No acute abdominal/pelvic findings, no CT findings to suggest acute pancreatitis, geographic fatty infiltration of the liver  Chronic blood loss anemia -Hemoglobin 16.5 on 03/04/2015 -Hemoccult stool negative -Iron saturation 37%, serum B12 and folate unremarkable -LDH 189 -repeat hemoccult x 1 -denies NSAIDS -CT abd/pelvis r/o retroperitoneal bleed -Follow up with PCP within one week, repeat CBC  Alcohol dependence -Was placed on CIWA -Patient states he was told it was "normal and OK" to have 4 shots per day -pt's N/V may have been related to Etoh-->pancreatitis- N/V resolved  Transaminasemia -fatty liver and Etoh -HIV antibody, Hep B surface Ag, Hep C Ab pending  Procedures: None  Consultations: None  Discharge Exam: Filed Vitals:   09/17/14 1300  BP: 119/74  Pulse: 107  Temp: 97.7 F (36.5 C)  Resp: 20     General: Well developed, well nourished, NAD, appears stated age  HEENT: NCAT, mucous membranes moist.  Cardiovascular: S1 S2 auscultated, no rubs, murmurs or gallops. Regular rate and rhythm.  Respiratory: Clear to auscultation bilaterally with equal chest rise  Abdomen: Soft, nontender, nondistended, + bowel sounds  Extremities: warm dry without cyanosis clubbing or edema  Neuro: AAOx3, nonfocal  Discharge Instructions      Discharge Instructions    Discharge instructions     Complete by:  As directed   Patient will be discharged to home.  He will need to follow-up with his primary care physician at the [facility].  He should have repeat CBC within 1 week of discharge. Patient was strongly urged to abstain from alcohol use.  He should continue a heart healthy diet.  Patient should continue his medications as prescribed.            Medication List    STOP taking these medications        lisinopril-hydrochlorothiazide 20-12.5 MG per tablet  Commonly known as:  PRINZIDE,ZESTORETIC      TAKE these medications        aspirin 81 MG chewable tablet  Chew 1 tablet (81 mg total) by mouth daily.     DRY EYES OP  Apply 1 drop to eye 2 (two) times daily.     folic acid 1 MG tablet  Commonly known as:  FOLVITE  Take 1 tablet (1 mg total) by mouth daily.     multivitamin with minerals Tabs tablet  Take 1 tablet by mouth daily.     thiamine 100 MG tablet  Take 1 tablet (100 mg total) by mouth daily.       Allergies  Allergen Reactions  . Other Hives and Shortness Of Breath    All Nuts--Almonds, pecans, etc.  . Peanuts [Peanut Oil] Hives and Shortness Of Breath  . Strawberry Hives   Follow-up Information    Follow up with Primary care physician at the [facility]. Schedule an appointment as soon as possible for a visit in 1 week.   Why:  Hospital follow up, repeat CBC       The results of significant diagnostics from this hospitalization (including imaging, microbiology, ancillary and laboratory) are listed below for reference.    Significant Diagnostic Studies: Ct Abdomen Pelvis Wo Contrast  09/17/2014   CLINICAL DATA:  Pancreatitis.  EXAM: CT ABDOMEN AND PELVIS WITHOUT CONTRAST  TECHNIQUE: Multidetector CT imaging of the abdomen and pelvis was performed following the standard protocol without IV contrast.  COMPARISON:  None.  FINDINGS: Lower chest: The lung bases are clear. No pleural effusion or pulmonary nodule. The heart is normal in size. No  pericardial effusion. The distal esophagus is grossly normal.  Hepatobiliary: Geographic fatty infiltration of the liver is noted. No definite hepatic lesions or intrahepatic biliary dilatation. The gallbladder is mildly contracted. No common bile duct dilatation.  Pancreas: Normal.  Spleen: Normal.  Adrenals/Urinary Tract: The adrenal glands and kidneys are grossly normal without contrast. No renal or obstructing ureteral calculi or bladder calculi. 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  Stomach/Bowel: The stomach, duodenum, small bowel and colon are unremarkable. No inflammatory changes, mass lesions or obstructive findings. The terminal ileum is normal. The appendix is normal. Scattered diverticulosis without findings for acute diverticulitis.  Vascular/Lymphatic: No mesenteric or retroperitoneal mass or adenopathy. Small scattered lymph nodes are noted.  Other: The bladder, prostate gland and seminal vesicles are unremarkable. No pelvic mass or adenopathy. No free pelvic fluid collections. No inguinal mass or adenopathy.  Musculoskeletal: No significant bony findings.  IMPRESSION: No acute abdominal/ pelvic findings, mass lesions or adenopathy.  No CT findings to suggest acute pancreatitis.  Geographic fatty  infiltration of the liver.   Electronically Signed   By: Rudie MeyerP.  Gallerani M.D.   On: 09/17/2014 12:41   [place] Renal  09/15/2014   CLINICAL DATA:  Patient with abnormal BUN and creatinine. History of hypertension.  EXAM: RENAL/URINARY TRACT ULTRASOUND COMPLETE  COMPARISON:  None.  FINDINGS: Right Kidney:  Length: 13.3 cm. Echogenicity within normal limits. No mass or hydronephrosis visualized.  Left Kidney:  Length: 11.1 cm. Echogenicity within normal limits. No mass or hydronephrosis visualized.  Bladder:  Appears normal for degree of bladder distention.  Visualized aspect of the liver is diffusely increased in echogenicity.  IMPRESSION: No hydronephrosis.  Increased echogenicity of the  visualized aspect of the liver, raising the possibility of hepatic steatosis.   Electronically Signed   By: Annia Beltrew  Davis M.D.   On: 09/15/2014 20:11   Dg Abd Acute W/chest  09/16/2014   CLINICAL DATA:  Weakness  EXAM: ACUTE ABDOMEN SERIES (ABDOMEN 2 VIEW & CHEST 1 VIEW)  COMPARISON:  03/03/2014  FINDINGS: Cardiac shadow is within normal limits. The lungs are well aerated bilaterally. No focal infiltrate or sizable effusion is seen.  Scattered large and small bowel gas is noted. Mild fecal material is noted within the colon. No abnormal mass or abnormal calcifications are seen.  IMPRESSION: Negative abdominal radiographs.  No acute cardiopulmonary disease.   Electronically Signed   By: Alcide CleverMark  Lukens M.D.   On: 09/16/2014 10:45    Microbiology: Recent Results (from the past 240 hour(s))  Culture, blood (routine x 2)     Status: None (Preliminary result)   Collection Time: 09/16/14  5:17 AM  Result Value Ref Range Status   Specimen Description BLOOD LEFT ASSIST CONTROL  Final   Special Requests BOTTLES DRAWN AEROBIC AND ANAEROBIC 4CC EACH  Final   Culture   Final           BLOOD CULTURE RECEIVED NO GROWTH TO DATE CULTURE WILL BE HELD FOR 5 DAYS BEFORE ISSUING A FINAL NEGATIVE REPORT [facility]    Report Status PENDING  Incomplete  Culture, blood (routine x 2)     Status: None (Preliminary result)   Collection Time: 09/16/14  5:30 AM  Result Value Ref Range Status   Specimen Description BLOOD LEFT HAND  Final   Special Requests BOTTLES DRAWN AEROBIC AND ANAEROBIC 5CC  Final   Culture   Final           BLOOD CULTURE RECEIVED NO GROWTH TO DATE CULTURE WILL BE HELD FOR 5 DAYS BEFORE ISSUING A FINAL NEGATIVE REPORT Performed [facility]    Report Status PENDING  Incomplete     Labs: Basic Metabolic Panel:  Recent Labs Lab 09/15/14 1435 09/16/14 0517 09/17/14 0528  NA 130* 131* 139  K 3.2* 3.2* 3.7  CL 93* 103 109  CO2 24 22 21   GLUCOSE 161* 157* 116*    BUN 69* 49* 25*  CREATININE 3.23* 2.21* 1.35  CALCIUM 9.8 9.0 8.4   Liver Function Tests:  Recent Labs Lab 09/15/14 1435 09/16/14 0517 09/17/14 0528  AST 78* 62* 42*  ALT 31 27 20   ALKPHOS 67 61 53  BILITOT 0.2* 0.5 0.6  PROT 7.7 6.2 5.4*  ALBUMIN 4.4 3.4* 2.9*    Recent Labs Lab 09/16/14 0517 09/17/14 0528  LIPASE 298* 193*   No results for input(s): AMMONIA in the last 168 hours. CBC:  Recent Labs Lab 09/15/14 1435 09/16/14 0517 09/17/14 0528  WBC 7.4 6.2 5.9  NEUTROABS 4.6 3.8  --   HGB 9.8* 9.0* 7.9*  HCT 27.7* 26.9* 23.5*  MCV 90.5 92.1 93.3  PLT 219 232 257   Cardiac Enzymes:  Recent Labs Lab 09/16/14 0517  TROPONINI <0.03   BNP: BNP (last 3 results) No results for input(s): BNP in the last 8760 hours.  ProBNP (last 3 results) No results for input(s): PROBNP in the last 8760 hours.  CBG: No results for input(s): GLUCAP in the last 168 hours.     SignedEdsel Petrin  [facility] 09/17/2014, 2:19 PM

## 2014-09-22 LAB — CULTURE, BLOOD (ROUTINE X 2)
CULTURE: NO GROWTH
Culture: NO GROWTH

## 2020-08-15 ENCOUNTER — Emergency Department (HOSPITAL_BASED_OUTPATIENT_CLINIC_OR_DEPARTMENT_OTHER): Payer: No Typology Code available for payment source

## 2020-08-15 ENCOUNTER — Other Ambulatory Visit: Payer: Self-pay

## 2020-08-15 ENCOUNTER — Emergency Department (HOSPITAL_BASED_OUTPATIENT_CLINIC_OR_DEPARTMENT_OTHER)
Admission: EM | Admit: 2020-08-15 | Discharge: 2020-08-15 | Disposition: A | Discharge status: Home / Self Care | Payer: No Typology Code available for payment source | Attending: Emergency Medicine | Admitting: Emergency Medicine

## 2020-08-15 ENCOUNTER — Encounter (HOSPITAL_BASED_OUTPATIENT_CLINIC_OR_DEPARTMENT_OTHER): Payer: Self-pay | Admitting: *Deleted

## 2020-08-15 DIAGNOSIS — K859 Acute pancreatitis without necrosis or infection, unspecified: Secondary | ICD-10-CM | POA: Diagnosis not present

## 2020-08-15 DIAGNOSIS — I10 Essential (primary) hypertension: Secondary | ICD-10-CM | POA: Insufficient documentation

## 2020-08-15 DIAGNOSIS — Z91040 Latex allergy status: Secondary | ICD-10-CM | POA: Diagnosis not present

## 2020-08-15 DIAGNOSIS — R1010 Upper abdominal pain, unspecified: Secondary | ICD-10-CM | POA: Diagnosis present

## 2020-08-15 DIAGNOSIS — F17210 Nicotine dependence, cigarettes, uncomplicated: Secondary | ICD-10-CM | POA: Insufficient documentation

## 2020-08-15 DIAGNOSIS — Z7982 Long term (current) use of aspirin: Secondary | ICD-10-CM | POA: Diagnosis not present

## 2020-08-15 LAB — COMPREHENSIVE METABOLIC PANEL
ALT: 6 U/L (ref 0–44)
AST: 17 U/L (ref 15–41)
Albumin: 4.4 g/dL (ref 3.5–5.0)
Alkaline Phosphatase: 88 U/L (ref 38–126)
Anion gap: 12 (ref 5–15)
BUN: 9 mg/dL (ref 6–20)
CO2: 26 mmol/L (ref 22–32)
Calcium: 10 mg/dL (ref 8.9–10.3)
Chloride: 94 mmol/L — ABNORMAL LOW (ref 98–111)
Creatinine, Ser: 0.98 mg/dL (ref 0.61–1.24)
GFR, Estimated: >60 mL/min (ref >60)
Glucose, Bld: 110 mg/dL — ABNORMAL HIGH (ref 70–99)
Potassium: 3.9 mmol/L (ref 3.5–5.1)
Sodium: 132 mmol/L — ABNORMAL LOW (ref 135–145)
Total Bilirubin: 0.4 mg/dL (ref 0.3–1.2)
Total Protein: 8.9 g/dL — ABNORMAL HIGH (ref 6.5–8.1)

## 2020-08-15 LAB — CBC
HCT: 43.4 % (ref 39.0–52.0)
Hemoglobin: 15 g/dL (ref 13.0–17.0)
MCH: 31.4 pg (ref 26.0–34.0)
MCHC: 34.6 g/dL (ref 30.0–36.0)
MCV: 90.8 fL (ref 80.0–100.0)
Platelets: 385 10*3/uL (ref 150–400)
RBC: 4.78 MIL/uL (ref 4.22–5.81)
RDW: 12.7 % (ref 11.5–15.5)
WBC: 11.5 10*3/uL — ABNORMAL HIGH (ref 4.0–10.5)
nRBC: 0 % (ref 0.0–0.2)

## 2020-08-15 LAB — URINALYSIS, ROUTINE W REFLEX MICROSCOPIC
Bilirubin Urine: NEGATIVE
Glucose, UA: NEGATIVE mg/dL
Hgb urine dipstick: NEGATIVE
Ketones, ur: NEGATIVE mg/dL
Leukocytes,Ua: NEGATIVE
Nitrite: NEGATIVE
Protein, ur: NEGATIVE mg/dL
Specific Gravity, Urine: 1.02 (ref 1.005–1.030)
pH: 6 (ref 5.0–8.0)

## 2020-08-15 LAB — LIPASE, BLOOD: Lipase: 109 U/L — ABNORMAL HIGH (ref 11–51)

## 2020-08-15 MED ORDER — KETOROLAC TROMETHAMINE 15 MG/ML IJ SOLN
15.0000 mg | Freq: Once | INTRAMUSCULAR | Status: AC
  Administered 2020-08-15: 15 mg via INTRAVENOUS
  Filled 2020-08-15: qty 1

## 2020-08-15 MED ORDER — IOHEXOL 300 MG/ML  SOLN
100.0000 mL | Freq: Once | INTRAMUSCULAR | Status: AC | PRN
  Administered 2020-08-15: 100 mL via INTRAVENOUS

## 2020-08-15 NOTE — ED Triage Notes (Addendum)
Pt reports lower abd pain since Tuesday. Denies n/v/d. Last BM on Tuesday. He went to the [place] yesterday and had an xray

## 2020-08-15 NOTE — ED Notes (Signed)
ED Provider at bedside. 

## 2020-08-15 NOTE — Discharge Instructions (Signed)
Please follow-up with your primary care doctor early next week.  Recommend bland, boring diet.  Avoid alcohol, spicy or greasy foods.  Take Tylenol or Motrin for pain control.

## 2020-08-15 NOTE — ED Provider Notes (Signed)
[facility] EMERGENCY DEPARTMENT Provider Note   CSN: [id number] Arrival date & time: 08/15/20  1725     History Chief Complaint  Patient presents with  . Abdominal Pain    Terry Peters is a 60 y.o. male.  Presenting to ER with concern for abdominal pain.  Has been having pain over the last few days.  Pain worse in his upper abdomen.  Currently mild.  Aching sensation.  No alleviating or aggravating factors.  Tried Pepto-Bismol without relief.  No vomiting, diarrhea or constipation.  No fevers.  HPI     Past Medical History:  Diagnosis Date  . Alcohol abuse   . Anemia   . Anxiety   . ARF (acute renal failure) (HCC) 09/2014  . DVT of proximal leg (deep vein thrombosis) (HCC)   . High blood pressure   . Hypertension     Patient Active Problem List   Diagnosis Date Noted  . Hypotension 09/16/2014  . Normocytic normochromic anemia 09/16/2014  . Dehydration 09/16/2014  . Acute kidney injury (HCC) 09/15/2014  . Tachycardia 03/03/2014  . Chest pain 03/03/2014  . Lactic acidosis 03/03/2014  . Alcohol intoxication (HCC) 03/03/2014  . Alcohol abuse 03/03/2014  . Unspecified essential hypertension 03/03/2014  . Anxiety 09/25/2013    Past Surgical History:  Procedure Laterality Date  . TOOTH EXTRACTION         Family History  Problem Relation Age of Onset  . Hypertension Mother     Social History   Tobacco Use  . Smoking status: Current Every Day Smoker    Packs/day: 0.50    Years: 25.00    Pack years: 12.50    Types: Cigarettes  . Smokeless tobacco: Never Used  Vaping Use  . Vaping Use: Never used  Substance Use Topics  . Alcohol use: Not Currently    Comment: pt states last drink on New Years  . Drug use: No    Home Medications Prior to Admission medications   Medication Sig Start Date End Date Taking? Authorizing Provider  Artificial Tear Ointment (DRY EYES OP) Apply 1 drop to eye 2 (two) times daily.    [provider]  aspirin 81  MG chewable tablet Chew 1 tablet (81 mg total) by mouth daily. 08/12/13   Darlys Gales, MD  folic acid (FOLVITE) 1 MG tablet Take 1 tablet (1 mg total) by mouth daily. 09/17/14   Edsel Petrin, DO  Multiple Vitamin (MULTIVITAMIN WITH MINERALS) TABS tablet Take 1 tablet by mouth daily. 09/17/14   Edsel Petrin, DO  thiamine 100 MG tablet Take 1 tablet (100 mg total) by mouth daily. 09/17/14   Edsel Petrin, DO    Allergies    Other, Peanuts [peanut oil], and Strawberry extract  Review of Systems   Review of Systems  Constitutional: Negative for chills and fever.  HENT: Negative for ear pain and sore throat.   Eyes: Negative for pain and visual disturbance.  Respiratory: Negative for cough and shortness of breath.   Cardiovascular: Negative for chest pain and palpitations.  Gastrointestinal: Positive for abdominal pain. Negative for vomiting.  Genitourinary: Negative for dysuria and hematuria.  Musculoskeletal: Negative for arthralgias and back pain.  Skin: Negative for color change and rash.  Neurological: Negative for seizures and syncope.  All other systems reviewed and are negative.   Physical Exam Updated Vital Signs BP (!) 155/105 (BP Location: Right Arm)   Pulse 87   Temp 98.3 F (36.8 C) (Oral)   Resp 18  SpO2 100%   Physical Exam Vitals and nursing note reviewed.  Constitutional:      Appearance: He is well-developed and well-nourished.  HENT:     Head: Normocephalic and atraumatic.  Eyes:     Conjunctiva/sclera: Conjunctivae normal.  Cardiovascular:     Rate and Rhythm: Normal rate and regular rhythm.     Heart sounds: No murmur heard.   Pulmonary:     Effort: Pulmonary effort is normal. No respiratory distress.     Breath sounds: Normal breath sounds.  Abdominal:     Palpations: Abdomen is soft.     Tenderness: There is abdominal tenderness.     Comments: Some tenderness in epigastrium and left upper quadrant, no rebound or guarding  Musculoskeletal:         General: No edema.     Cervical back: Neck supple.  Skin:    General: Skin is warm and dry.  Neurological:     General: No focal deficit present.     Mental Status: He is alert.  Psychiatric:        Mood and Affect: Mood and affect and mood normal.        Behavior: Behavior normal.     ED Results / Procedures / Treatments   Labs (all labs ordered are listed, but only abnormal results are displayed) Labs Reviewed  LIPASE, BLOOD - Abnormal; Notable for the following components:      Result Value   Lipase 109 (*)    All other components within normal limits  COMPREHENSIVE METABOLIC PANEL - Abnormal; Notable for the following components:   Sodium 132 (*)    Chloride 94 (*)    Glucose, Bld 110 (*)    Total Protein 8.9 (*)    All other components within normal limits  CBC - Abnormal; Notable for the following components:   WBC 11.5 (*)    All other components within normal limits  URINALYSIS, ROUTINE W REFLEX MICROSCOPIC - Abnormal; Notable for the following components:   APPearance HAZY (*)    All other components within normal limits    EKG None  Radiology CT ABDOMEN PELVIS W CONTRAST  Result Date: 08/15/2020 CLINICAL DATA:  Is lower abdominal pain.  Prior acute renal failure. EXAM: CT ABDOMEN AND PELVIS WITH CONTRAST TECHNIQUE: Multidetector CT imaging of the abdomen and pelvis was performed using the standard protocol following bolus administration of intravenous contrast. CONTRAST:  OMNIPAQUE IOHEXOL 300 MG/ML  SOLN COMPARISON:  CT 09/17/2014 FINDINGS: Lower chest: Lung bases are clear. Hepatobiliary: No focal hepatic lesion. No biliary duct dilatation. Common bile duct is normal. Pancreas: There is mild haziness within the fat adjacent to the uncinate and head of the pancreas (image [id number] of series 2). The body and tail the pancreas normal. No pancreatic duct dilatation. No common bile duct dilatation. Spleen: Normal spleen Adrenals/urinary tract: Adrenal glands  and kidneys are normal. The ureters and bladder normal. Stomach/Bowel: Stomach, duodenum small-bowel normal. Terminal ileum normal. Appendix normal. LEFT colon and rectosigmoid colon normal. Vascular/Lymphatic: Abdominal aorta is normal caliber with atherosclerotic calcification. There is no retroperitoneal or periportal lymphadenopathy. No pelvic lymphadenopathy. Reproductive: Prostate normal Other: No free fluid. Musculoskeletal: No aggressive osseous lesion. IMPRESSION: 1. Mild inflammation and edema of the pancreatic head and uncinate suggests focal acute pancreatitis. 2. No radiodense gallstones evident. The gallbladder is nondistended. No common bile duct dilatation. 3. No diverticulosis. Electronically Signed   By: Genevive Bi M.D.   On: 08/15/2020 20:12  Procedures Procedures   Medications Ordered in ED Medications  ketorolac (TORADOL) 15 MG/ML injection 15 mg (15 mg Intravenous Given 08/15/20 1910)  iohexol (OMNIPAQUE) 300 MG/ML solution 100 mL (100 mLs Intravenous Contrast Given 08/15/20 1944)    ED Course  I have reviewed the triage vital signs and the nursing notes.  Pertinent labs & imaging results that were available during my care of the patient were reviewed by me and considered in my medical decision making (see chart for details).    MDM Rules/Calculators/A&P                         60 year old male presents to ER with concern for abdominal pain.  On exam he is well-appearing, noted some tenderness in his epigastrium, left upper quadrant.  Lab work demonstrated mild elevation in lipase, CT with mild inflammation around pancreas concerning for acute pancreatitis.  Suspect this is etiology of his symptoms.  He has no pain on reassessment, remains remarkably well-appearing with normal vitals.  Believe he is appropriate for outpatient management at this time.  Reviewed return precautions and discharged home.  Recommended recheck with his primary doctor early next week.  Reviewed  dietary recommendations.  After the discussed management above, the patient was determined to be safe for discharge.  The patient was in agreement with this plan and all questions regarding their care were answered.  ED return precautions were discussed and the patient will return to the ED with any significant worsening of condition.    Final Clinical Impression(s) / ED Diagnoses Final diagnoses:  Acute pancreatitis without infection or necrosis, unspecified pancreatitis type    Rx / DC Orders ED Discharge Orders    None       Milagros Loll, MD 08/15/20 2143

## 2020-08-15 NOTE — ED Notes (Signed)
Assumed care of this patient. Vitals taken. Pt reports abd pain without bowel movements sine Tuesday. A&Ox4. Respirations regular/unlabored. Connected to BP and pulse ox. Stretcher low, wheels locked, call bell within reach.

## 2021-05-15 ENCOUNTER — Encounter (HOSPITAL_BASED_OUTPATIENT_CLINIC_OR_DEPARTMENT_OTHER): Payer: Self-pay | Admitting: *Deleted

## 2021-05-15 ENCOUNTER — Other Ambulatory Visit: Payer: Self-pay

## 2021-05-15 ENCOUNTER — Emergency Department (HOSPITAL_BASED_OUTPATIENT_CLINIC_OR_DEPARTMENT_OTHER)
Admission: EM | Admit: 2021-05-15 | Discharge: 2021-05-15 | Disposition: A | Discharge status: Home / Self Care | Payer: No Typology Code available for payment source | Attending: Emergency Medicine | Admitting: Emergency Medicine

## 2021-05-15 DIAGNOSIS — Z7982 Long term (current) use of aspirin: Secondary | ICD-10-CM | POA: Diagnosis not present

## 2021-05-15 DIAGNOSIS — F17210 Nicotine dependence, cigarettes, uncomplicated: Secondary | ICD-10-CM | POA: Diagnosis not present

## 2021-05-15 DIAGNOSIS — R1110 Vomiting, unspecified: Secondary | ICD-10-CM | POA: Insufficient documentation

## 2021-05-15 DIAGNOSIS — E860 Dehydration: Secondary | ICD-10-CM | POA: Diagnosis not present

## 2021-05-15 DIAGNOSIS — R000 Tachycardia, unspecified: Secondary | ICD-10-CM | POA: Diagnosis not present

## 2021-05-15 DIAGNOSIS — I10 Essential (primary) hypertension: Secondary | ICD-10-CM | POA: Diagnosis not present

## 2021-05-15 DIAGNOSIS — Z91010 Allergy to peanuts: Secondary | ICD-10-CM | POA: Insufficient documentation

## 2021-05-15 DIAGNOSIS — J029 Acute pharyngitis, unspecified: Secondary | ICD-10-CM | POA: Insufficient documentation

## 2021-05-15 DIAGNOSIS — R1111 Vomiting without nausea: Secondary | ICD-10-CM

## 2021-05-15 LAB — COMPREHENSIVE METABOLIC PANEL
ALT: 27 U/L (ref 0–44)
AST: 107 U/L — ABNORMAL HIGH (ref 15–41)
Albumin: 4.3 g/dL (ref 3.5–5.0)
Alkaline Phosphatase: 80 U/L (ref 38–126)
Anion gap: 19 — ABNORMAL HIGH (ref 5–15)
BUN: 30 mg/dL — ABNORMAL HIGH (ref 6–20)
CO2: 24 mmol/L (ref 22–32)
Calcium: 8.4 mg/dL — ABNORMAL LOW (ref 8.9–10.3)
Chloride: 84 mmol/L — ABNORMAL LOW (ref 98–111)
Creatinine, Ser: 1.52 mg/dL — ABNORMAL HIGH (ref 0.61–1.24)
GFR, Estimated: 52 mL/min — ABNORMAL LOW (ref >60)
Glucose, Bld: 142 mg/dL — ABNORMAL HIGH (ref 70–99)
Potassium: 3.6 mmol/L (ref 3.5–5.1)
Sodium: 127 mmol/L — ABNORMAL LOW (ref 135–145)
Total Bilirubin: 1.3 mg/dL — ABNORMAL HIGH (ref 0.3–1.2)
Total Protein: 8.1 g/dL (ref 6.5–8.1)

## 2021-05-15 LAB — CBC WITH DIFFERENTIAL/PLATELET
Abs Immature Granulocytes: 0.04 10*3/uL (ref 0.00–0.07)
Basophils Absolute: 0 10*3/uL (ref 0.0–0.1)
Basophils Relative: 0 %
Eosinophils Absolute: 0 10*3/uL (ref 0.0–0.5)
Eosinophils Relative: 0 %
HCT: 36.2 % — ABNORMAL LOW (ref 39.0–52.0)
Hemoglobin: 13 g/dL (ref 13.0–17.0)
Immature Granulocytes: 0 %
Lymphocytes Relative: 13 %
Lymphs Abs: 1.4 10*3/uL (ref 0.7–4.0)
MCH: 32.2 pg (ref 26.0–34.0)
MCHC: 35.9 g/dL (ref 30.0–36.0)
MCV: 89.6 fL (ref 80.0–100.0)
Monocytes Absolute: 1.2 10*3/uL — ABNORMAL HIGH (ref 0.1–1.0)
Monocytes Relative: 11 %
Neutro Abs: 8.5 10*3/uL — ABNORMAL HIGH (ref 1.7–7.7)
Neutrophils Relative %: 76 %
Platelets: 201 10*3/uL (ref 150–400)
RBC: 4.04 MIL/uL — ABNORMAL LOW (ref 4.22–5.81)
RDW: 13.6 % (ref 11.5–15.5)
WBC: 11.1 10*3/uL — ABNORMAL HIGH (ref 4.0–10.5)
nRBC: 0 % (ref 0.0–0.2)

## 2021-05-15 LAB — LIPASE, BLOOD: Lipase: 30 U/L (ref 11–51)

## 2021-05-15 MED ORDER — ONDANSETRON HCL 4 MG/2ML IJ SOLN
4.0000 mg | Freq: Once | INTRAMUSCULAR | Status: AC
  Administered 2021-05-15: 4 mg via INTRAVENOUS
  Filled 2021-05-15: qty 2

## 2021-05-15 MED ORDER — SODIUM CHLORIDE 0.9 % IV BOLUS
1000.0000 mL | Freq: Once | INTRAVENOUS | Status: AC
  Administered 2021-05-15: 1000 mL via INTRAVENOUS

## 2021-05-15 MED ORDER — ONDANSETRON HCL 4 MG PO TABS: 4.0000 mg | ORAL_TABLET | Freq: Four times a day (QID) | ORAL | 0 refills | Status: DC

## 2021-05-15 MED ORDER — ALUM & MAG HYDROXIDE-SIMETH 200-200-20 MG/5ML PO SUSP
30.0000 mL | Freq: Once | ORAL | Status: AC
  Administered 2021-05-15: 30 mL via ORAL
  Filled 2021-05-15: qty 30

## 2021-05-15 MED ORDER — DEXAMETHASONE SODIUM PHOSPHATE 10 MG/ML IJ SOLN
10.0000 mg | Freq: Once | INTRAMUSCULAR | Status: AC
  Administered 2021-05-15: 10 mg via INTRAVENOUS
  Filled 2021-05-15: qty 1

## 2021-05-15 MED ORDER — LIDOCAINE VISCOUS HCL 2 % MT SOLN
15.0000 mL | Freq: Once | OROMUCOSAL | Status: AC
  Administered 2021-05-15: 15 mL via ORAL
  Filled 2021-05-15: qty 15

## 2021-05-15 NOTE — ED Triage Notes (Signed)
Pt reports he was vomiting "all day yesterday". Today his throat is sore. Denies fever. Denies diarrhea

## 2021-05-15 NOTE — Discharge Instructions (Addendum)
You have been given a long-acting steroid to help with your sore throat.  I have also prescribed you nausea medicine called Zofran.  Overall suspect this was a foodborne illness that seems to be improving.  Please continue to drink water and Gatorade type drinks.  Please cut back on any alcohol use and focus on hydration.

## 2021-05-15 NOTE — ED Notes (Signed)
ED Provider at bedside. 

## 2021-05-15 NOTE — ED Provider Notes (Signed)
[facility] EMERGENCY DEPARTMENT Provider Note   CSN: [id number] Arrival date & time: 05/15/21  1329     History Chief Complaint  Patient presents with   Emesis    Terry Peters is a 60 y.o. male.  The history is provided by the patient.  Emesis Severity:  Moderate Duration:  1 day Quality:  Stomach contents Able to tolerate:  Liquids Progression:  Improving Chronicity:  New Worsened by:  Nothing Associated symptoms: sore throat (sore throat after throwing up)   Associated symptoms: no abdominal pain, no arthralgias, no chills, no cough, no diarrhea, no fever, no headaches and no myalgias   Risk factors: suspect food intake (started after eating pasta salad yesterday)   Risk factors: no alcohol use       Past Medical History:  Diagnosis Date   Alcohol abuse    Anemia    Anxiety    ARF (acute renal failure) ([place]) 09/2014   DVT of proximal leg (deep vein thrombosis) ([facility])    High blood pressure    Hypertension     Patient Active Problem List   Diagnosis Date Noted   Hypotension 09/16/2014   Normocytic normochromic anemia 09/16/2014   Dehydration 09/16/2014   Acute kidney injury ([place]) 09/15/2014   Tachycardia 03/03/2014   Chest pain 03/03/2014   Lactic acidosis 03/03/2014   Alcohol intoxication ([place]) 03/03/2014   Alcohol abuse 03/03/2014   Unspecified essential hypertension 03/03/2014   Anxiety 09/25/2013    Past Surgical History:  Procedure Laterality Date   TOOTH EXTRACTION         Family History  Problem Relation Age of Onset   Hypertension Mother     Social History   Tobacco Use   Smoking status: Every Day    Packs/day: 0.50    Years: 25.00    Pack years: 12.50    Types: Cigarettes   Smokeless tobacco: Never  Vaping Use   Vaping Use: Never used  Substance Use Topics   Alcohol use: Yes    Comment: occasional   Drug use: No    Home Medications Prior to Admission medications   Medication Sig Start Date End Date Taking?  Authorizing Provider  ondansetron (ZOFRAN) 4 MG tablet Take 1 tablet (4 mg total) by mouth every 6 (six) hours. 05/15/21  Yes Jalena Vanderlinden, DO  Artificial Tear Ointment (DRY EYES OP) Apply 1 drop to eye 2 (two) times daily.    [provider]  aspirin 81 MG chewable tablet Chew 1 tablet (81 mg total) by mouth daily. 08/12/13   Elmer Sow, MD  folic acid (FOLVITE) 1 MG tablet Take 1 tablet (1 mg total) by mouth daily. 09/17/14   Cristal Ford, DO  Multiple Vitamin (MULTIVITAMIN WITH MINERALS) TABS tablet Take 1 tablet by mouth daily. 09/17/14   Cristal Ford, DO  thiamine 100 MG tablet Take 1 tablet (100 mg total) by mouth daily. 09/17/14   Cristal Ford, DO    Allergies    Other, Peanuts [peanut oil], and Strawberry extract  Review of Systems   Review of Systems  Constitutional:  Negative for chills and fever.  HENT:  Positive for sore throat (sore throat after throwing up). Negative for ear pain.   Eyes:  Negative for pain and visual disturbance.  Respiratory:  Negative for cough and shortness of breath.   Cardiovascular:  Negative for chest pain and palpitations.  Gastrointestinal:  Positive for vomiting. Negative for abdominal pain, diarrhea and nausea.  Genitourinary:  Negative for  dysuria and hematuria.  Musculoskeletal:  Negative for arthralgias, back pain and myalgias.  Skin:  Negative for color change and rash.  Neurological:  Negative for seizures, syncope and headaches.  All other systems reviewed and are negative.  Physical Exam Updated Vital Signs BP (!) 149/105 (BP Location: Right Arm)   Pulse (!) 108   Temp 99.1 F (37.3 C) (Oral)   Resp (!) 24   Ht 5\' 9"  (1.753 m)   Wt 82.6 kg   SpO2 99%   BMI 26.88 kg/m   Physical Exam Vitals and nursing note reviewed.  Constitutional:      General: He is not in acute distress.    Appearance: He is well-developed. He is not ill-appearing.  HENT:     Head: Normocephalic and atraumatic.     Right Ear:  Tympanic membrane normal.     Left Ear: Tympanic membrane normal.     Nose: Nose normal.     Mouth/Throat:     Mouth: Mucous membranes are moist.     Pharynx: No oropharyngeal exudate or posterior oropharyngeal erythema.  Eyes:     Conjunctiva/sclera: Conjunctivae normal.     Pupils: Pupils are equal, round, and reactive to light.  Cardiovascular:     Rate and Rhythm: Regular rhythm. Tachycardia present.     Heart sounds: Normal heart sounds. No murmur heard. Pulmonary:     Effort: Pulmonary effort is normal. No respiratory distress.     Breath sounds: Normal breath sounds.  Abdominal:     General: Abdomen is flat.     Palpations: Abdomen is soft.     Tenderness: There is no abdominal tenderness.  Musculoskeletal:        General: No swelling.     Cervical back: Normal range of motion and neck supple.  Skin:    General: Skin is warm and dry.     Capillary Refill: Capillary refill takes less than 2 seconds.  Neurological:     General: No focal deficit present.     Mental Status: He is alert.  Psychiatric:        Mood and Affect: Mood normal.    ED Results / Procedures / Treatments   Labs (all labs ordered are listed, but only abnormal results are displayed) Labs Reviewed  CBC WITH DIFFERENTIAL/PLATELET - Abnormal; Notable for the following components:      Result Value   WBC 11.1 (*)    RBC 4.04 (*)    HCT 36.2 (*)    Neutro Abs 8.5 (*)    Monocytes Absolute 1.2 (*)    All other components within normal limits  COMPREHENSIVE METABOLIC PANEL - Abnormal; Notable for the following components:   Sodium 127 (*)    Chloride 84 (*)    Glucose, Bld 142 (*)    BUN 30 (*)    Creatinine, Ser 1.52 (*)    Calcium 8.4 (*)    AST 107 (*)    Total Bilirubin 1.3 (*)    GFR, Estimated 52 (*)    Anion gap 19 (*)    All other components within normal limits  LIPASE, BLOOD    EKG EKG Interpretation  Date/Time:  Saturday May 15 2021 13:54:48 EST Ventricular Rate:  109 PR  Interval:  156 QRS Duration: 87 QT Interval:  361 QTC Calculation: 487 R Axis:   53 Text Interpretation: Sinus tachycardia Atrial premature complex Confirmed by Lockie Mola, Jodeci Roarty ([id number]) on 05/15/2021 1:56:30 PM  Radiology No results found.  Procedures Procedures  Medications Ordered in ED Medications  dexamethasone (DECADRON) injection 10 mg (has no administration in time range)  sodium chloride 0.9 % bolus 1,000 mL (1,000 mLs Intravenous New Bag/Given 05/15/21 1411)  ondansetron (ZOFRAN) injection 4 mg (4 mg Intravenous Given 05/15/21 1412)  alum & mag hydroxide-simeth (MAALOX/MYLANTA) 200-200-20 MG/5ML suspension 30 mL (30 mLs Oral Given 05/15/21 1445)    And  lidocaine (XYLOCAINE) 2 % viscous mouth solution 15 mL (15 mLs Oral Given 05/15/21 1445)    ED Course  I have reviewed the triage vital signs and the nursing notes.  Pertinent labs & imaging results that were available during my care of the patient were reviewed by me and considered in my medical decision making (see chart for details).    MDM Rules/Calculators/A&P                           Cliford Sequeira is a 60 year old male with history of hypertension, alcohol abuse who presents the ED with sore throat after having nausea and vomiting syndrome yesterday.  Patient states that he ate leftover pasta salad and shortly afterwards started to have nausea and vomiting.  No abdominal pain or diarrhea.  He states that today nausea and vomiting have improved but his throat feels sore.  He has no signs of throat infection on exam.  Suspect that this is secondary from throwing up.  He has been able to tolerate p.o. but he is mildly tachycardic.  Will give IV fluids, GI cocktail including lidocaine.  We will check his labs to make sure he does not have any electrolyte abnormalities.  Overall suspect foodborne illness.  Lab work shows some evidence of dehydration with sodium of 127, creatinine of 1.5, anion gap mildly elevated at 19.  However  blood sugar 142 and CO2 24.  Suspect this is related to GI losses.  Heart rate has improved with IV fluids which patient was given.  Overall he is tolerating p.o.  Suspect he will continue to do well.  He has not had any melena or hematochezia or hematemesis.  Recommended that he cut back on alcohol.  Overall foodborne illness causing some dehydration but overall patient tolerated p.o.  Given IV fluids.  Vital signs within normal limits.  Discharged in good condition.  Understands return precautions.  This chart was dictated using voice recognition software.  Despite best efforts to proofread,  errors can occur which can change the documentation meaning.   Final Clinical Impression(s) / ED Diagnoses Final diagnoses:  Vomiting without nausea, unspecified vomiting type    Rx / DC Orders ED Discharge Orders          Ordered    ondansetron (ZOFRAN) 4 MG tablet  Every 6 hours        05/15/21 1430             Frona Yost, DO 05/15/21 1449

## 2021-07-04 ENCOUNTER — Other Ambulatory Visit: Payer: Self-pay

## 2021-07-04 ENCOUNTER — Encounter (HOSPITAL_BASED_OUTPATIENT_CLINIC_OR_DEPARTMENT_OTHER): Payer: Self-pay | Admitting: Emergency Medicine

## 2021-07-04 ENCOUNTER — Emergency Department (HOSPITAL_BASED_OUTPATIENT_CLINIC_OR_DEPARTMENT_OTHER): Payer: No Typology Code available for payment source

## 2021-07-04 ENCOUNTER — Inpatient Hospital Stay (HOSPITAL_BASED_OUTPATIENT_CLINIC_OR_DEPARTMENT_OTHER)
Admission: EM | Admit: 2021-07-04 | Discharge: 2021-07-06 | DRG: 683 | Disposition: A | Discharge status: Home / Self Care | Payer: No Typology Code available for payment source | Attending: Family Medicine | Admitting: Family Medicine

## 2021-07-04 DIAGNOSIS — Z20822 Contact with and (suspected) exposure to covid-19: Secondary | ICD-10-CM | POA: Diagnosis present

## 2021-07-04 DIAGNOSIS — Z91018 Allergy to other foods: Secondary | ICD-10-CM | POA: Diagnosis not present

## 2021-07-04 DIAGNOSIS — A419 Sepsis, unspecified organism: Secondary | ICD-10-CM

## 2021-07-04 DIAGNOSIS — N179 Acute kidney failure, unspecified: Secondary | ICD-10-CM

## 2021-07-04 DIAGNOSIS — N170 Acute kidney failure with tubular necrosis: Secondary | ICD-10-CM | POA: Diagnosis present

## 2021-07-04 DIAGNOSIS — I248 Other forms of acute ischemic heart disease: Secondary | ICD-10-CM | POA: Diagnosis present

## 2021-07-04 DIAGNOSIS — I10 Essential (primary) hypertension: Secondary | ICD-10-CM | POA: Diagnosis present

## 2021-07-04 DIAGNOSIS — E875 Hyperkalemia: Secondary | ICD-10-CM | POA: Diagnosis present

## 2021-07-04 DIAGNOSIS — E860 Dehydration: Secondary | ICD-10-CM | POA: Diagnosis present

## 2021-07-04 DIAGNOSIS — K529 Noninfective gastroenteritis and colitis, unspecified: Secondary | ICD-10-CM | POA: Diagnosis present

## 2021-07-04 DIAGNOSIS — F1020 Alcohol dependence, uncomplicated: Secondary | ICD-10-CM | POA: Diagnosis present

## 2021-07-04 DIAGNOSIS — F17210 Nicotine dependence, cigarettes, uncomplicated: Secondary | ICD-10-CM | POA: Diagnosis present

## 2021-07-04 DIAGNOSIS — Z86718 Personal history of other venous thrombosis and embolism: Secondary | ICD-10-CM | POA: Diagnosis not present

## 2021-07-04 DIAGNOSIS — Z79899 Other long term (current) drug therapy: Secondary | ICD-10-CM

## 2021-07-04 DIAGNOSIS — Z7982 Long term (current) use of aspirin: Secondary | ICD-10-CM | POA: Diagnosis not present

## 2021-07-04 DIAGNOSIS — D72823 Leukemoid reaction: Secondary | ICD-10-CM | POA: Diagnosis present

## 2021-07-04 DIAGNOSIS — I959 Hypotension, unspecified: Secondary | ICD-10-CM | POA: Diagnosis present

## 2021-07-04 DIAGNOSIS — Z9102 Food additives allergy status: Secondary | ICD-10-CM | POA: Diagnosis not present

## 2021-07-04 DIAGNOSIS — Z91010 Allergy to peanuts: Secondary | ICD-10-CM

## 2021-07-04 DIAGNOSIS — E872 Acidosis, unspecified: Secondary | ICD-10-CM | POA: Diagnosis present

## 2021-07-04 DIAGNOSIS — R55 Syncope and collapse: Secondary | ICD-10-CM | POA: Diagnosis present

## 2021-07-04 DIAGNOSIS — Z8249 Family history of ischemic heart disease and other diseases of the circulatory system: Secondary | ICD-10-CM | POA: Diagnosis not present

## 2021-07-04 DIAGNOSIS — F419 Anxiety disorder, unspecified: Secondary | ICD-10-CM | POA: Diagnosis present

## 2021-07-04 DIAGNOSIS — R159 Full incontinence of feces: Secondary | ICD-10-CM | POA: Diagnosis present

## 2021-07-04 LAB — BASIC METABOLIC PANEL
Anion gap: 33 — ABNORMAL HIGH (ref 5–15)
Anion gap: 15 (ref 5–15)
BUN: 46 mg/dL — ABNORMAL HIGH (ref 6–20)
BUN: 47 mg/dL — ABNORMAL HIGH (ref 6–20)
CO2: 12 mmol/L — ABNORMAL LOW (ref 22–32)
CO2: 20 mmol/L — ABNORMAL LOW (ref 22–32)
Calcium: 7.6 mg/dL — ABNORMAL LOW (ref 8.9–10.3)
Calcium: 6.5 mg/dL — ABNORMAL LOW (ref 8.9–10.3)
Chloride: 88 mmol/L — ABNORMAL LOW (ref 98–111)
Chloride: 95 mmol/L — ABNORMAL LOW (ref 98–111)
Creatinine, Ser: 6.44 mg/dL — ABNORMAL HIGH (ref 0.61–1.24)
Creatinine, Ser: 4.98 mg/dL — ABNORMAL HIGH (ref 0.61–1.24)
GFR, Estimated: 9 mL/min — ABNORMAL LOW (ref >60)
GFR, Estimated: 13 mL/min — ABNORMAL LOW (ref >60)
Glucose, Bld: 211 mg/dL — ABNORMAL HIGH (ref 70–99)
Glucose, Bld: 172 mg/dL — ABNORMAL HIGH (ref 70–99)
Potassium: 5.9 mmol/L — ABNORMAL HIGH (ref 3.5–5.1)
Potassium: 4.3 mmol/L (ref 3.5–5.1)
Sodium: 133 mmol/L — ABNORMAL LOW (ref 135–145)
Sodium: 130 mmol/L — ABNORMAL LOW (ref 135–145)

## 2021-07-04 LAB — DIFFERENTIAL
Abs Immature Granulocytes: 0 10*3/uL (ref 0.00–0.07)
Basophils Absolute: 0.5 10*3/uL — ABNORMAL HIGH (ref 0.0–0.1)
Basophils Relative: 1 %
Eosinophils Absolute: 0 10*3/uL (ref 0.0–0.5)
Eosinophils Relative: 0 %
Lymphocytes Relative: 6 %
Lymphs Abs: 3 10*3/uL (ref 0.7–4.0)
Monocytes Absolute: 1.5 10*3/uL — ABNORMAL HIGH (ref 0.1–1.0)
Monocytes Relative: 3 %
Neutro Abs: 45.5 10*3/uL — ABNORMAL HIGH (ref 1.7–7.7)
Neutrophils Relative %: 90 %
Smear Review: NORMAL

## 2021-07-04 LAB — PROTIME-INR
INR: 1.2 (ref 0.8–1.2)
Prothrombin Time: 15.6 seconds — ABNORMAL HIGH (ref 11.4–15.2)

## 2021-07-04 LAB — URINALYSIS, ROUTINE W REFLEX MICROSCOPIC
Bilirubin Urine: NEGATIVE
Glucose, UA: NEGATIVE mg/dL
Ketones, ur: 40 mg/dL — AB
Leukocytes,Ua: NEGATIVE
Nitrite: NEGATIVE
Protein, ur: 100 mg/dL — AB
Specific Gravity, Urine: >1.03 — ABNORMAL HIGH (ref 1.005–1.030)
pH: 5.5 (ref 5.0–8.0)

## 2021-07-04 LAB — RESP PANEL BY RT-PCR (FLU A&B, COVID) ARPGX2
Influenza A by PCR: NEGATIVE
Influenza B by PCR: NEGATIVE
SARS Coronavirus 2 by RT PCR: NEGATIVE

## 2021-07-04 LAB — LACTIC ACID, PLASMA
Lactic Acid, Venous: >9 mmol/L (ref 0.5–1.9)
Lactic Acid, Venous: 6.7 mmol/L (ref 0.5–1.9)
Lactic Acid, Venous: 2.3 mmol/L (ref 0.5–1.9)

## 2021-07-04 LAB — HEPATIC FUNCTION PANEL
ALT: 19 U/L (ref 0–44)
AST: 99 U/L — ABNORMAL HIGH (ref 15–41)
Albumin: 3.8 g/dL (ref 3.5–5.0)
Alkaline Phosphatase: 83 U/L (ref 38–126)
Bilirubin, Direct: 0.2 mg/dL (ref 0.0–0.2)
Indirect Bilirubin: 1 mg/dL — ABNORMAL HIGH (ref 0.3–0.9)
Total Bilirubin: 1.2 mg/dL (ref 0.3–1.2)
Total Protein: 7.7 g/dL (ref 6.5–8.1)

## 2021-07-04 LAB — URINALYSIS, MICROSCOPIC (REFLEX)

## 2021-07-04 LAB — CBC
HCT: 28.4 % — ABNORMAL LOW (ref 39.0–52.0)
Hemoglobin: 10 g/dL — ABNORMAL LOW (ref 13.0–17.0)
MCH: 30.9 pg (ref 26.0–34.0)
MCHC: 35.2 g/dL (ref 30.0–36.0)
MCV: 87.7 fL (ref 80.0–100.0)
Platelets: 297 10*3/uL (ref 150–400)
RBC: 3.24 MIL/uL — ABNORMAL LOW (ref 4.22–5.81)
RDW: 13 % (ref 11.5–15.5)
WBC: 32.9 10*3/uL — ABNORMAL HIGH (ref 4.0–10.5)
nRBC: 0 % (ref 0.0–0.2)

## 2021-07-04 LAB — APTT: aPTT: 28 seconds (ref 24–36)

## 2021-07-04 LAB — ETHANOL: Alcohol, Ethyl (B): <10 mg/dL (ref <10)

## 2021-07-04 LAB — TROPONIN I (HIGH SENSITIVITY)
Troponin I (High Sensitivity): 131 ng/L (ref <18)
Troponin I (High Sensitivity): 104 ng/L (ref <18)

## 2021-07-04 LAB — LIPASE, BLOOD: Lipase: 68 U/L — ABNORMAL HIGH (ref 11–51)

## 2021-07-04 LAB — CBG MONITORING, ED: Glucose-Capillary: 196 mg/dL — ABNORMAL HIGH (ref 70–99)

## 2021-07-04 LAB — OCCULT BLOOD X 1 CARD TO LAB, STOOL: Fecal Occult Bld: POSITIVE — AB

## 2021-07-04 MED ORDER — FOLIC ACID 1 MG PO TABS
1.0000 mg | ORAL_TABLET | Freq: Every day | ORAL | Status: DC
  Administered 2021-07-04 – 2021-07-06 (×3): 1 mg via ORAL
  Filled 2021-07-04 (×3): qty 1

## 2021-07-04 MED ORDER — SODIUM CHLORIDE 0.9 % IV BOLUS (SEPSIS)
1000.0000 mL | Freq: Once | INTRAVENOUS | Status: AC
  Administered 2021-07-04: 1000 mL via INTRAVENOUS

## 2021-07-04 MED ORDER — HEPARIN SODIUM (PORCINE) 5000 UNIT/ML IJ SOLN
5000.0000 [IU] | Freq: Three times a day (TID) | INTRAMUSCULAR | Status: DC
  Administered 2021-07-04 – 2021-07-06 (×5): 5000 [IU] via SUBCUTANEOUS
  Filled 2021-07-04 (×5): qty 1

## 2021-07-04 MED ORDER — LACTATED RINGERS IV BOLUS
2000.0000 mL | Freq: Once | INTRAVENOUS | Status: AC
  Administered 2021-07-04: 2000 mL via INTRAVENOUS

## 2021-07-04 MED ORDER — METRONIDAZOLE 500 MG/100ML IV SOLN
500.0000 mg | Freq: Two times a day (BID) | INTRAVENOUS | Status: DC
  Administered 2021-07-04 – 2021-07-05 (×3): 500 mg via INTRAVENOUS
  Filled 2021-07-04 (×3): qty 100

## 2021-07-04 MED ORDER — LACTATED RINGERS IV BOLUS (SEPSIS)
1000.0000 mL | Freq: Once | INTRAVENOUS | Status: AC
  Administered 2021-07-04: 1000 mL via INTRAVENOUS

## 2021-07-04 MED ORDER — CHLORHEXIDINE GLUCONATE CLOTH 2 % EX PADS
6.0000 | MEDICATED_PAD | Freq: Every day | CUTANEOUS | Status: DC
  Administered 2021-07-04 – 2021-07-06 (×3): 6 via TOPICAL

## 2021-07-04 MED ORDER — LACTATED RINGERS IV SOLN: INTRAVENOUS | Status: AC

## 2021-07-04 MED ORDER — VANCOMYCIN HCL IN DEXTROSE 1-5 GM/200ML-% IV SOLN
1000.0000 mg | INTRAVENOUS | Status: AC
  Administered 2021-07-04 (×2): 1000 mg via INTRAVENOUS
  Filled 2021-07-04 (×2): qty 200

## 2021-07-04 MED ORDER — SODIUM CHLORIDE 0.9 % IV SOLN
2.0000 g | Freq: Once | INTRAVENOUS | Status: AC
  Administered 2021-07-04: 2 g via INTRAVENOUS
  Filled 2021-07-04: qty 2

## 2021-07-04 MED ORDER — ADULT MULTIVITAMIN W/MINERALS CH: 1.0000 | ORAL_TABLET | Freq: Every day | ORAL | Status: DC

## 2021-07-04 MED ORDER — ORAL CARE MOUTH RINSE
15.0000 mL | Freq: Two times a day (BID) | OROMUCOSAL | Status: DC
  Administered 2021-07-04 – 2021-07-06 (×4): 15 mL via OROMUCOSAL

## 2021-07-04 MED ORDER — ONDANSETRON HCL 4 MG/2ML IJ SOLN
4.0000 mg | Freq: Once | INTRAMUSCULAR | Status: DC
  Filled 2021-07-04: qty 2

## 2021-07-04 MED ORDER — VANCOMYCIN VARIABLE DOSE PER UNSTABLE RENAL FUNCTION (PHARMACIST DOSING)
Status: DC
  Filled 2021-07-04: qty 1

## 2021-07-04 MED ORDER — SODIUM CHLORIDE 0.9 % IV SOLN: INTRAVENOUS | Status: DC | PRN

## 2021-07-04 MED ORDER — LORAZEPAM 2 MG/ML IJ SOLN
1.0000 mg | INTRAMUSCULAR | Status: DC | PRN
  Administered 2021-07-04: 1 mg via INTRAVENOUS
  Filled 2021-07-04: qty 1

## 2021-07-04 MED ORDER — THIAMINE HCL 100 MG/ML IJ SOLN
100.0000 mg | Freq: Once | INTRAMUSCULAR | Status: AC
  Administered 2021-07-04: 100 mg via INTRAVENOUS
  Filled 2021-07-04: qty 2

## 2021-07-04 MED ORDER — VANCOMYCIN HCL IN DEXTROSE 1-5 GM/200ML-% IV SOLN: 1000.0000 mg | Freq: Once | INTRAVENOUS | Status: DC

## 2021-07-04 MED ORDER — METRONIDAZOLE 500 MG/100ML IV SOLN
500.0000 mg | Freq: Once | INTRAVENOUS | Status: AC
  Administered 2021-07-04: 500 mg via INTRAVENOUS
  Filled 2021-07-04: qty 100

## 2021-07-04 MED ORDER — THIAMINE HCL 100 MG PO TABS
100.0000 mg | ORAL_TABLET | Freq: Every day | ORAL | Status: DC
  Administered 2021-07-04: 100 mg via ORAL
  Filled 2021-07-04: qty 1

## 2021-07-04 MED ORDER — LORAZEPAM 1 MG PO TABS: 1.0000 mg | ORAL_TABLET | ORAL | Status: DC | PRN

## 2021-07-04 MED ORDER — FOLIC ACID 1 MG PO TABS: 1.0000 mg | ORAL_TABLET | Freq: Every day | ORAL | Status: DC

## 2021-07-04 MED ORDER — SODIUM ZIRCONIUM CYCLOSILICATE 10 G PO PACK
10.0000 g | PACK | Freq: Once | ORAL | Status: AC
  Administered 2021-07-04: 10 g via ORAL
  Filled 2021-07-04: qty 1

## 2021-07-04 MED ORDER — ADULT MULTIVITAMIN W/MINERALS CH
1.0000 | ORAL_TABLET | Freq: Every day | ORAL | Status: DC
  Administered 2021-07-04 – 2021-07-06 (×3): 1 via ORAL
  Filled 2021-07-04 (×3): qty 1

## 2021-07-04 MED ORDER — THIAMINE HCL 100 MG PO TABS
100.0000 mg | ORAL_TABLET | Freq: Every day | ORAL | Status: DC
  Administered 2021-07-05 – 2021-07-06 (×2): 100 mg via ORAL
  Filled 2021-07-04 (×2): qty 1

## 2021-07-04 MED ORDER — SODIUM CHLORIDE 0.9 % IV SOLN
2.0000 g | INTRAVENOUS | Status: DC
  Administered 2021-07-05: 2 g via INTRAVENOUS
  Filled 2021-07-04 (×2): qty 2

## 2021-07-04 MED ORDER — SODIUM CHLORIDE 0.9 % IV SOLN: 1.0000 g | INTRAVENOUS | Status: DC

## 2021-07-04 MED ORDER — ONDANSETRON HCL 4 MG PO TABS
4.0000 mg | ORAL_TABLET | Freq: Four times a day (QID) | ORAL | Status: DC
  Administered 2021-07-04 – 2021-07-06 (×7): 4 mg via ORAL
  Filled 2021-07-04 (×7): qty 1

## 2021-07-04 MED ORDER — THIAMINE HCL 100 MG/ML IJ SOLN
100.0000 mg | Freq: Every day | INTRAMUSCULAR | Status: DC
  Administered 2021-07-04: 100 mg via INTRAVENOUS
  Filled 2021-07-04: qty 2

## 2021-07-04 NOTE — ED Provider Notes (Signed)
[facility] EMERGENCY DEPARTMENT Provider Note   CSN: [id number] Arrival date & time: 07/04/21  0750     History  Chief Complaint  Patient presents with   Loss of Consciousness    Terry Peters is a 61 y.o. male.  Patient presenting with a complaint of passing out every time he stands up for the past 3 days.  Also 4-5 episodes of vomiting in the last 24 hours.  Patient has a history of alcohol use.  Does not quantitate how much.  But past medical history raises concerns for history of alcohol abuse.  States he last had something to drink on Wednesday.  Patient denied any chest pain denied any upper respiratory symptoms.  Denied any fevers.  Denied any abdominal pain did have the nausea and vomiting denied any diarrhea.  We had seen patient December 3 for epigastric discomfort and vomiting.  But patient's labs at that time were reasonably normal.  Did have a GFR of 51.  She also has records that show that he has been seen for pancreatitis in the past.  Past medical history sniffing for hypertension anxiety alcohol abuse history of acute renal failure in 2016.  And a remote history of a DVT in the leg and is not currently on blood thinners.      Home Medications Prior to Admission medications   Medication Sig Start Date End Date Taking? Authorizing Provider  Artificial Tear Ointment (DRY EYES OP) Apply 1 drop to eye 2 (two) times daily.    [provider]  aspirin 81 MG chewable tablet Chew 1 tablet (81 mg total) by mouth daily. 08/12/13   Elmer Sow, MD  folic acid (FOLVITE) 1 MG tablet Take 1 tablet (1 mg total) by mouth daily. 09/17/14   Cristal Ford, DO  Multiple Vitamin (MULTIVITAMIN WITH MINERALS) TABS tablet Take 1 tablet by mouth daily. 09/17/14   Mikhail, Velta Addison, DO  ondansetron (ZOFRAN) 4 MG tablet Take 1 tablet (4 mg total) by mouth every 6 (six) hours. 05/15/21   Curatolo, Adam, DO  thiamine 100 MG tablet Take 1 tablet (100 mg total) by mouth daily.  09/17/14   Cristal Ford, DO      Allergies    Other, Peanuts [peanut oil], and Strawberry extract    Review of Systems   Review of Systems  Constitutional:  Negative for chills and fever.  HENT:  Negative for ear pain and sore throat.   Eyes:  Negative for pain and visual disturbance.  Respiratory:  Negative for cough and shortness of breath.   Cardiovascular:  Negative for chest pain and palpitations.  Gastrointestinal:  Positive for nausea and vomiting. Negative for abdominal pain.  Genitourinary:  Negative for dysuria and hematuria.  Musculoskeletal:  Negative for arthralgias and back pain.  Skin:  Negative for color change and rash.  Neurological:  Positive for syncope. Negative for seizures.  All other systems reviewed and are negative.  Physical Exam Updated Vital Signs BP 111/62    Pulse (!) 114    Temp 98.5 F (36.9 C) (Oral)    Resp (!) 22    Ht 1.753 m (5\' 9" )    Wt 83.9 kg    SpO2 100%    BMI 27.32 kg/m  Physical Exam Vitals and nursing note reviewed.  Constitutional:      General: He is in acute distress.     Appearance: Normal appearance. He is well-developed. He is not diaphoretic.  HENT:     Head:  Normocephalic and atraumatic.     Mouth/Throat:     Mouth: Mucous membranes are dry.  Eyes:     Extraocular Movements: Extraocular movements intact.     Conjunctiva/sclera: Conjunctivae normal.     Pupils: Pupils are equal, round, and reactive to light.  Cardiovascular:     Rate and Rhythm: Regular rhythm. Tachycardia present.     Heart sounds: No murmur heard. Pulmonary:     Effort: Pulmonary effort is normal. No respiratory distress.     Breath sounds: Normal breath sounds. No wheezing, rhonchi or rales.  Abdominal:     General: There is no distension.     Palpations: Abdomen is soft.     Tenderness: There is no abdominal tenderness. There is no guarding.  Musculoskeletal:        General: No swelling.     Cervical back: Neck supple. No rigidity.  Skin:     General: Skin is warm and dry.     Capillary Refill: Capillary refill takes 2 to 3 seconds.  Neurological:     General: No focal deficit present.     Mental Status: He is alert and oriented to person, place, and time.     Cranial Nerves: No cranial nerve deficit.     Sensory: No sensory deficit.  Psychiatric:        Mood and Affect: Mood normal.    ED Results / Procedures / Treatments   Labs (all labs ordered are listed, but only abnormal results are displayed) Labs Reviewed  BASIC METABOLIC PANEL - Abnormal; Notable for the following components:      Result Value   Sodium 133 (*)    Potassium 5.9 (*)    Chloride 88 (*)    CO2 12 (*)    Glucose, Bld 211 (*)    BUN 46 (*)    Creatinine, Ser 6.44 (*)    Calcium 7.6 (*)    GFR, Estimated 9 (*)    Anion gap 33 (*)    All other components within normal limits  URINALYSIS, ROUTINE W REFLEX MICROSCOPIC - Abnormal; Notable for the following components:   APPearance CLOUDY (*)    Specific Gravity, Urine >1.030 (*)    Hgb urine dipstick LARGE (*)    Ketones, ur 40 (*)    Protein, ur 100 (*)    All other components within normal limits  LACTIC ACID, PLASMA - Abnormal; Notable for the following components:   Lactic Acid, Venous >9.0 (*)    All other components within normal limits  LACTIC ACID, PLASMA - Abnormal; Notable for the following components:   Lactic Acid, Venous 6.7 (*)    All other components within normal limits  PROTIME-INR - Abnormal; Notable for the following components:   Prothrombin Time 15.6 (*)    All other components within normal limits  DIFFERENTIAL - Abnormal; Notable for the following components:   Neutro Abs 45.5 (*)    Monocytes Absolute 1.5 (*)    Basophils Absolute 0.5 (*)    All other components within normal limits  LIPASE, BLOOD - Abnormal; Notable for the following components:   Lipase 68 (*)    All other components within normal limits  CBC - Abnormal; Notable for the following components:    WBC 50.5 (*)    RBC 3.91 (*)    Hemoglobin 12.1 (*)    HCT 36.6 (*)    Platelets 432 (*)    All other components within normal limits  HEPATIC FUNCTION PANEL -  Abnormal; Notable for the following components:   AST 99 (*)    Indirect Bilirubin 1.0 (*)    All other components within normal limits  OCCULT BLOOD X 1 CARD TO LAB, STOOL - Abnormal; Notable for the following components:   Fecal Occult Bld POSITIVE (*)    All other components within normal limits  URINALYSIS, MICROSCOPIC (REFLEX) - Abnormal; Notable for the following components:   Bacteria, UA MANY (*)    Non Squamous Epithelial PRESENT (*)    All other components within normal limits  LACTIC ACID, PLASMA - Abnormal; Notable for the following components:   Lactic Acid, Venous 2.3 (*)    All other components within normal limits  CBC - Abnormal; Notable for the following components:   WBC 32.9 (*)    RBC 3.24 (*)    Hemoglobin 10.0 (*)    HCT 28.4 (*)    All other components within normal limits  BASIC METABOLIC PANEL - Abnormal; Notable for the following components:   Sodium 130 (*)    Chloride 95 (*)    CO2 20 (*)    Glucose, Bld 172 (*)    BUN 47 (*)    Creatinine, Ser 4.98 (*)    Calcium 6.5 (*)    GFR, Estimated 13 (*)    All other components within normal limits  CBG MONITORING, ED - Abnormal; Notable for the following components:   Glucose-Capillary 196 (*)    All other components within normal limits  TROPONIN I (HIGH SENSITIVITY) - Abnormal; Notable for the following components:   Troponin I (High Sensitivity) 131 (*)    All other components within normal limits  TROPONIN I (HIGH SENSITIVITY) - Abnormal; Notable for the following components:   Troponin I (High Sensitivity) 104 (*)    All other components within normal limits  RESP PANEL BY RT-PCR (FLU A&B, COVID) ARPGX2  CULTURE, BLOOD (ROUTINE X 2)  CULTURE, BLOOD (ROUTINE X 2)  URINE CULTURE  MRSA NEXT GEN BY PCR, NASAL  APTT  ETHANOL  POC OCCULT  BLOOD, ED    EKG EKG Interpretation  Date/Time:  Sunday July 04 2021 07:57:21 EST Ventricular Rate:  111 PR Interval:  152 QRS Duration: 94 QT Interval:  344 QTC Calculation: 468 R Axis:   51 Text Interpretation: Sinus tachycardia Abnormal R-wave progression, early transition Confirmed by Fredia Sorrow [phone]) on 07/04/2021 8:16:33 AM  Radiology CT Abdomen Pelvis Wo Contrast  Result Date: 07/04/2021 CLINICAL DATA:  Abdominal pain, nausea, vomiting EXAM: CT ABDOMEN AND PELVIS WITHOUT CONTRAST TECHNIQUE: Multidetector CT imaging of the abdomen and pelvis was performed following the standard protocol without IV contrast. RADIATION DOSE REDUCTION: This exam was performed according to the departmental dose-optimization program which includes automated exposure control, adjustment of the mA and/or kV according to patient size and/or use of iterative reconstruction technique. COMPARISON:  08/15/2020 FINDINGS: Lower chest: No acute abnormality. Hepatobiliary: No solid liver abnormality is seen. No gallstones, gallbladder wall thickening, or biliary dilatation. Pancreas: Unremarkable. No pancreatic ductal dilatation or surrounding inflammatory changes. Spleen: Normal in size without significant abnormality. Adrenals/Urinary Tract: Adrenal glands are unremarkable. New bilateral perinephric fat stranding. Small nonobstructive calculus of the inferior pole of left kidney. No right-sided calculi, ureteral calculi, or hydronephrosis. Bladder is unremarkable. Stomach/Bowel: Stomach is within normal limits. Appendix appears normal. No evidence of bowel wall thickening, distention, or inflammatory changes. Colon is fluid-filled to the rectum. Vascular/Lymphatic: Aortic atherosclerosis. No enlarged abdominal or pelvic lymph nodes. Reproductive: No mass or other  significant abnormality. Other: No abdominal wall hernia or abnormality. No ascites. Musculoskeletal: No acute or significant osseous findings.  IMPRESSION: 1. Colon is fluid-filled to the rectum, suggestive of diarrheal illness. 2. New bilateral perinephric fat stranding, suggesting infection or inflammation. Correlate with urinalysis. 3. Small nonobstructive calculus of the inferior pole of left kidney. No right-sided calculi, ureteral calculi, or hydronephrosis. Aortic Atherosclerosis (ICD10-I70.0). Electronically Signed   By: Delanna Ahmadi M.D.   On: 07/04/2021 13:05   DG Chest Port 1 View  Result Date: 07/04/2021 CLINICAL DATA:  61 year old male with possible sepsis. EXAM: PORTABLE CHEST 1 VIEW COMPARISON:  Chest CTA 03/03/2014 and earlier. FINDINGS: Portable AP semi upright view at 0824 hours. Mildly rotated to the right. Lung volumes and mediastinal contours remain within normal limits. Visualized tracheal air column is within normal limits. Allowing for portable technique the lungs are clear. No pneumothorax or pleural effusion. No acute osseous abnormality identified. Visible bowel-gas within normal limits. IMPRESSION: Negative portable chest. Electronically Signed   By: Genevie Ann M.D.   On: 07/04/2021 08:34    Procedures Procedures    Medications Ordered in ED Medications  lactated ringers infusion ( Intravenous Stopped 07/04/21 1338)  ondansetron (ZOFRAN) injection 4 mg (4 mg Intravenous Not Given 07/04/21 0853)  0.9 %  sodium chloride infusion ( Intravenous Stopped 07/04/21 1320)  ceFEPIme (MAXIPIME) 2 g in sodium chloride 0.9 % 100 mL IVPB (has no administration in time range)  vancomycin variable dose per unstable renal function (pharmacist dosing) (has no administration in time range)  sodium chloride 0.9 % bolus 1,000 mL (0 mLs Intravenous Stopping Infusion hung by another clincian 07/04/21 1100)  lactated ringers bolus 1,000 mL (0 mLs Intravenous Stopped 07/04/21 1101)    And  lactated ringers bolus 1,000 mL (0 mLs Intravenous Stopped 07/04/21 1209)    And  lactated ringers bolus 1,000 mL (0 mLs Intravenous Stopped 07/04/21  1319)  ceFEPIme (MAXIPIME) 2 g in sodium chloride 0.9 % 100 mL IVPB (0 g Intravenous Stopped 07/04/21 0929)  metroNIDAZOLE (FLAGYL) IVPB 500 mg (0 mg Intravenous Stopped 07/04/21 1208)  vancomycin (VANCOCIN) IVPB 1000 mg/200 mL premix (0 mg Intravenous Stopped 07/04/21 1317)  sodium zirconium cyclosilicate (LOKELMA) packet 10 g (10 g Oral Given 07/04/21 1150)  thiamine (B-1) injection 100 mg (100 mg Intravenous Given 07/04/21 1322)  lactated ringers bolus 2,000 mL (2,000 mLs Intravenous New Bag/Given 07/04/21 1343)    ED Course/ Medical Decision Making/ A&P                           Medical Decision Making Amount and/or Complexity of Data Reviewed Labs: ordered. Radiology: ordered. ECG/medicine tests: ordered.  Risk Prescription drug management. Decision regarding hospitalization.   CRITICAL CARE Performed by: Fredia Sorrow Total critical care time: 100 minutes Critical care time was exclusive of separately billable procedures and treating other patients. Critical care was necessary to treat or prevent imminent or life-threatening deterioration. Critical care was time spent personally by me on the following activities: development of treatment plan with patient and/or surrogate as well as nursing, discussions with consultants, evaluation of patient's response to treatment, examination of patient, obtaining history from patient or surrogate, ordering and performing treatments and interventions, ordering and review of laboratory studies, ordering and review of radiographic studies, pulse oximetry and re-evaluation of patient's condition.  Will summarize patient's presentation no.  But did contact critical care late morning about admission due to the presentation of hypotension and  the marked lactic acidosis.  And the significant acute kidney injury.  With a felt he was moving in the right direction since lactic acid went from greater than 9 down to the 6 range.  So they wanted to go ahead and  repeat labs at 130.  Those have been done.  Just spoke with critical care specialist Sanjuana Letters and they feel that he is improving enough even though he is working on the 6 L of fluid that he can be admitted to the stepdown to the hospitalist service.  Patient arrived here with blood pressure in the [id number] systolic.  And officially recorded at low 80s.  Patient tachycardic but was mentating okay.  Story was that for the past 3 days so when he stands up he passes out he said that about 4-5 episodes of vomiting in the last 24 hours.  No diarrhea.  No abdominal pain.  Patient does admit to drinking says his last drink was on Wednesday.  Denied drinking anything unusual like moonshine or ethylene glycol or methanol.  Patient was seen in early December on chart review for epigastric abdominal pain has had pancreatitis before.  Patient was not hypotensive at that time.  On presentation patient's vital signs triggered sepsis protocol.  He was treated with broad-spectrum antibiotics.  And now received 30 cc/kg of fluid.  That equated to 3 L of fluid.  This did bring his blood pressures up some but they got more stable after the fourth liter of fluid.  Patient went on to have fifth liter.  And then his blood pressures got soft again.  So now we have ordered the sixth and seventh liters.  He is working on the 6 L now.  Labs otherwise concerning for sepsis included a white blood cell count of greater than 50,000.  And a lactic acid which was already mention.  Source for the possible sepsis not clear.  Urine may show some evidence of infection but not clear-cut.  Chest x-ray was negative.    Other labs of significance included significant acute kidney injury with a GFR of 9.  And potassium was slightly elevated at 5.9.  Patient CO2 is 12.  Glucose 211.  Patient received some Lokelma for the slightly elevated potassium.  The patient's EKG and cardiac monitoring showed no peaked T waves or any widening of the QRS.  In  addition patient's initial troponin was 131.  Delta troponin had come down to 104.  We repeated his labs here at around 1:30 PM.  Patient's lactic acid improved to 2.3.  Patient's hemoglobin had dropped down some to 10 started out at 12.  Would have expected with him being hemoconcentrated for this to drop some.  But it did not drop significantly.  This was being checked because patient's stool was heme positive.  But not grossly melanotic or grossly bloody.  Patient's white blood cell count improved to 32.9.  The differential on that on the first CBC was normally distributed.  Patient's renal function improved slightly with a GFR of 13.  Potassium was better at 4.3.  Blood sugar a little more elevated at 172.  Patient also received thiamine.  And had a CT scan of the abdomen and pelvis mostly to make sure that there was no ureteral blockage or bladder blockage.  There was no evidence of that.  But the did show signs it could be consistent with some infection.  Patient's urinalysis was questionable infection.  Has stated though patient received broad-spectrum  antibiotics with antibiotics I got should cover that.  Patient is requiring large amounts of fluid.  Working on 6 L.  Got seventh liter ordered.  Blood pressures are in the low 100s.  We will discussed with the hospitalist for stepdown admission.  As discussed above already has spoken to critical care about the patient.  Poke with Dr. Malen Gauze hospitalist and he will make arrangements to get him admitted to stepdown either at [facility] or [facility].  Will tell the evening emergency physician Dr. Pearline Cables that if there is seems to be delays on getting a stepdown bed that this patient may be better served going ED to ED.  Final Clinical Impression(s) / ED Diagnoses Final diagnoses:  Sepsis, due to unspecified organism, unspecified whether acute organ dysfunction present ([place])  Hypotension, unspecified hypotension type  AKI (acute kidney injury) ([place])   Lactic acidosis    Rx / DC Orders ED Discharge Orders     None         Fredia Sorrow, MD 07/04/21 1506

## 2021-07-04 NOTE — ED Notes (Signed)
IV insertion attempt made for second IV access, unsuccessful. Lactated ringers on hold due to incompatibility with IV antibiotics. IV normal saline infusing as secondary for hanging antibiotics.

## 2021-07-04 NOTE — Plan of Care (Signed)
°  Problem: Clinical Measurements: Goal: Respiratory complications will improve Outcome: Progressing Goal: Cardiovascular complication will be avoided Outcome: Progressing   Problem: Coping: Goal: Level of anxiety will decrease Outcome: Progressing   Problem: Elimination: Goal: Will not experience complications related to urinary retention Outcome: Progressing   Problem: Pain Managment: Goal: General experience of comfort will improve Outcome: Progressing   Problem: Safety: Goal: Ability to remain free from injury will improve Outcome: Progressing   Problem: Skin Integrity: Goal: Risk for impaired skin integrity will decrease Outcome: Progressing   Problem: Respiratory: Goal: Ability to maintain adequate ventilation will improve Outcome: Progressing

## 2021-07-04 NOTE — Hospital Course (Signed)
l   Hemoccult positive but no overt bleeding

## 2021-07-04 NOTE — ED Provider Notes (Signed)
° °  3:14 PM Patient signed out to me by previous ED physician  Pt is a 61 yo male with pmh of alcohol abuse presenting for LOC. LOC x 3 days with standing. Emesis for 3-4 days. Last drink Wednesday.   Vitals on arrival: 114 bpm, 22 bpm, 70/ Triggered sepsis protocol. Received broad spectrum antibiotics. LA >9, WBC 50, GFR 9, signs of dehydration. Received 6 L IVF CT demonstrates no ureter obstruction. No signs of bladder distension  Repeat labs: LA >9>>6.7>>2.3  Admitted to step down unit but waiting for bed.    Plan: If BP drops, admits to CC. If still here by 5pm consider ED to ED transfer  Physical Exam  BP 111/62    Pulse (!) 114    Temp 98.5 F (36.9 C) (Oral)    Resp (!) 22    Ht 5\' 9"  (1.753 m)    Wt 83.9 kg    SpO2 100%    BMI 27.32 kg/m   Physical Exam Vitals and nursing note reviewed.  Constitutional:      Appearance: Normal appearance.  HENT:     Head: Normocephalic and atraumatic.  Cardiovascular:     Rate and Rhythm: Regular rhythm. Tachycardia present.  Pulmonary:     Effort: Pulmonary effort is normal.     Breath sounds: Normal breath sounds.  Abdominal:     General: Abdomen is flat.     Palpations: Abdomen is soft.  Neurological:     Mental Status: He is alert.    Procedures  Procedures  ED Course / MDM    Medical Decision Making Amount and/or Complexity of Data Reviewed Labs: ordered. Radiology: ordered.  Risk Prescription drug management. Decision regarding hospitalization.   12:17 PM EMS arrived shortly after sign out. Patient remains awake and stable upon leaving ED.        P, DO 07/06/21 1217

## 2021-07-04 NOTE — ED Notes (Signed)
Pt incontinent of brown liquid stool; pt cleaned and changed; adult brief applied at this time

## 2021-07-04 NOTE — ED Notes (Signed)
ED provider made aware of failed attempt to gain second IV access and updated on low blood pressure.

## 2021-07-04 NOTE — ED Triage Notes (Signed)
Pt arrives pov, c/o several syncope episodes today. Also reports "havent slept for 3 days". Endorses n/v and sore throat.

## 2021-07-04 NOTE — ED Notes (Signed)
ED Provider at bedside. 

## 2021-07-04 NOTE — Sepsis Progress Note (Signed)
Blood culture drawn prior to antibiotic given.

## 2021-07-04 NOTE — ED Notes (Signed)
Only able to obtain 1 set of blood cultures. Will attempt 2nd IV after fluid hydration

## 2021-07-04 NOTE — Progress Notes (Signed)
Pharmacy Antibiotic Note  Terry Peters is a 61 y.o. male admitted on 07/04/2021 with sepsis.  Pharmacy has been consulted for vancomycin/cefepime dosing.  Scr 6.44 (up from 1.52 on 05/15/21) No previous culture data  Plan: Vancomycin 1000mg  x2 LD (based on pyxis availability) Cefepime 2gm q24hr No standing vancomycin ordered based on acute kidney injury Plan to obtain vancomycin random 1/24 with am labs to guide further therapy Will monitor for acute changes in renal function and adjust as needed F/u cultures results and de-escalate as appropriate   Height: 5\' 9"  (175.3 cm) Weight: 83.9 kg (185 lb) IBW/kg (Calculated) : 70.7  Temp (24hrs), Avg:98.5 F (36.9 C), Min:98.5 F (36.9 C), Max:98.5 F (36.9 C)  Recent Labs  Lab 07/04/21 0800 07/04/21 0840  WBC 50.5*  --   CREATININE 6.44*  --   LATICACIDVEN  --  >9.0*    Estimated Creatinine Clearance: 12.2 mL/min (A) (by C-G formula based on SCr of 6.44 mg/dL (H)).    Allergies  Allergen Reactions   Other Hives and Shortness Of Breath    All Nuts--Almonds, pecans, etc.   Peanuts [Peanut Oil] Hives and Shortness Of Breath   Strawberry Extract Hives    Microbiology results: 1/22 BCx: IP 1/22 MRSA nares: IP  Thank you for allowing pharmacy to be a part of this patients care.  Donnald Garre, PharmD Clinical Pharmacist  Please check AMION for all [place] numbers After 10:00 PM, call [place] [phone]

## 2021-07-04 NOTE — Sepsis Progress Note (Signed)
Sepsis protocol is being followed by [facility]. 

## 2021-07-04 NOTE — ED Notes (Signed)
Patient O2 reading 88% on monitor. Patient O2 ranging from 90-94% on RA. Patient was 100% on RA at arrival. Patient placed on 2 L [place] at this time. Will titrate O2 as needed to maintain O2 goal. RN made aware.

## 2021-07-04 NOTE — H&P (Signed)
HPI  Terry Peters TWS:[id number] DOB: [birth year] DOA: 07/04/2021  PCP: [facility]   Chief Complaint: Nausea diarrhea  HPI:  61 y/o comm dwelling male former veteran Prior heavy EtOH and alcohol dependence Hypertension Remote DVT history  3-day history of 4-5 episodes of vomiting--in the setting of drinking alcohol last on 06/30/2021 Patient very hypotensive to 70 systolic blood pressure on arrival to [facility]-received 6 L total of fluids Had a incontinent liquid brown stoo X3 times while he was in the ED  Tells me he drinks 1/5 of Hennessy occasionally and last took this on Wednesday-no ill contacts no outside food no exotic pets no rash Describes also an episode of going to the bathroom where he just felt "dizzy and did not remember a whole lot after that he fell himself on the ground" This happened a second time when he went to the bathroom and that is the main reason for coming to the emergency room   Review of Systems:   Pertinent -"s: Headache blurred vision double vision unilateral weakness chest pain dark stool tarry stool rash cough cold abnormal swelling sputum sore throat earache photophobia phonophobia neck stiffness dysuria  ED Course:   ED course-patient was given 6 L of fluids started on Flagyl cefepime vancomycin, kept on a rate of fluid 150 cc/h kept n.p.o. Blood cultures x2 were obtained given multiple deranged metabolic abnormalities and significant leukocytosis    Past Medical History:  Diagnosis Date   Alcohol abuse    Anemia    Anxiety    ARF (acute renal failure) (HCC) 09/2014   DVT of proximal leg (deep vein thrombosis) (HCC)    High blood pressure    Hypertension    Past Surgical History:  Procedure Laterality Date   TOOTH EXTRACTION      reports that he has been smoking cigarettes. He has a 12.50 pack-year smoking history. He has never used smokeless tobacco. He reports current alcohol use. He reports that he does not use  drugs.  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   Peanuts [Peanut Oil] Hives and Shortness Of Breath   Strawberry Extract Hives   Family History  Problem Relation Age of Onset   Hypertension Mother    Prior to Admission medications   Medication Sig Start Date End Date Taking? Authorizing Provider  Artificial Tear Ointment (DRY EYES OP) Apply 1 drop to eye 2 (two) times daily.    [provider]  aspirin 81 MG chewable tablet Chew 1 tablet (81 mg total) by mouth daily. 08/12/13   Darlys Gales, MD  folic acid (FOLVITE) 1 MG tablet Take 1 tablet (1 mg total) by mouth daily. 09/17/14   Edsel Petrin, DO  Multiple Vitamin (MULTIVITAMIN WITH MINERALS) TABS tablet Take 1 tablet by mouth daily. 09/17/14   Mikhail, Nita Sells, DO  ondansetron (ZOFRAN) 4 MG tablet Take 1 tablet (4 mg total) by mouth every 6 (six) hours. 05/15/21   Curatolo, Adam, DO  thiamine 100 MG tablet Take 1 tablet (100 mg total) by mouth daily. 09/17/14   Edsel Petrin, DO    Physical Exam:  Vitals:   07/04/21 1711 07/04/21 1728  BP:    Pulse: (!) 104   Resp: (!) 21   Temp:  98.2 F (36.8 C)  SpO2: 100%     Awake thick neck Mallampati 4 no icterus mild  pallor slight submandibular lymphadenopathy Chest clear Patient is warm to touch ROM is intact S1-S2 tachycardic sinus tach on monitors Abdomen soft no rebound no guarding cannot appreciate organomegaly No lower extremity edema Power is 5/5 Affect is slightly flat Psych euthymic  I have personally reviewed following labs and imaging studies  Labs:  Initial lactic acid 9.0-->2.3 WBC 50.5-->32.9 Potassium 5.9, sodium 133, BUN/creatinine 46/6.4, CO2 12, anion gap 33 on admission Troponin 131, trending to 104    Imaging studies:  CT abdomen pelvis 1. Colon is fluid-filled to the rectum, suggestive of diarrheal illness. 2.  New bilateral perinephric fat stranding, suggesting infection or inflammation. Correlate with urinalysis. 3. Small nonobstructive calculus of the inferior pole of left kidney. No right-sided calculi, ureteral calculi, or hydronephrosis.  Chest x-ray is negative  Medical tests:  EKG showed sinus tachycardia PR interval 0.20, QRS axis 45 no ST-T wave changes borderline criteria   Test discussed with performing physician: n   Decision to obtain old records:  y   Review and summation of old records:  y   Principal Problem:   Hypotension   Assessment/Plan Severe sepsis?  As evidenced by hypotension elevated lactate AKI  Secondary to diarrheal illness seen in CT Obtain GI pathogen panel, C. difficile, fecal lactoferrin Supportive management fluids 150 cc/h Empiric vancomycin, cefepime, Flagyl Contact precautions AKI secondary to ATN with severe anion gap metabolic acidosis likely secondary to profuse diarrhea/septic picture on admission AKI seems to be trending better CO2 is improved from 12-20 Hyperkalemia 5.9 improved with fluids Will need replacement of calcium Recheck labs in a.m. as he is hemodynamically stable Elevated lipase?  Transaminase elevation in AST to ALT ratio 99/19 Secondary to his alcohol use Monitor Type II demand ischemia on admission secondary to sepsis Trend troponin-patient has no overt chest pain-this is likely related to sepsis and should resolve with treatment Can schedule echo as an outpatient Probable undiagnosed diabetes mellitus If sugars consistently above 140 order will be placed for sliding scale coverage ?  Ethanolism Obtain CIWA if needed will need to start the protocol and treat    Severity of Illness: The appropriate patient status for this patient is INPATIENT. Inpatient status is judged to be reasonable and necessary in order to provide the required intensity of service to ensure the patient's safety. The patient's presenting  symptoms, physical exam findings, and initial radiographic and laboratory data in the context of their chronic comorbidities is felt to place them at high risk for further clinical deterioration. Furthermore, it is not anticipated that the patient will be medically stable for discharge from the hospital within 2 midnights of admission.   * I certify that at the point of admission it is my clinical judgment that the patient will require inpatient hospital care spanning beyond 2 midnights from the point of admission due to high intensity of service, high risk for further deterioration and high frequency of surveillance required.*   DVT prophylaxis:heparin Code Status: full Family Communication: none Consults called: none    Time spent: 50 minutes  Mahala Menghini, MD Cordelia Poche my NP partners at night for Care related issues] [facility] --[employer] OR , [email or web address]; password [facility]  07/04/2021, 5:54 PM

## 2021-07-04 NOTE — Plan of Care (Signed)
° °  I spoke with Dr. Deretha Emory about this patient via telephone after I was contacted by [facility] to assist in arranging admission.  The patient is a 61 year old with no apparent known chronic medical history apart from suspected alcohol abuse who presented to [facility] with a complaint of syncope occurring multiple times over the last 2 to 3 days anytime he attempted to stand up.  He also reported 4-5 episodes of bland vomiting.  At presentation he was found to have a systolic blood pressure of 78 and was tachycardic.  Lactic acid was markedly elevated at greater than 9.  WBC was markedly elevated at approximately 50,000 and the patient was in acute renal failure with GFR of 9.  The patient has received attentive care while waiting in the ED.  PCCM was initially contacted for admission and reportedly requested ongoing volume resuscitation with reassessment.  With aggressive volume resuscitation in the ED, now approaching 6 L infused, the patient's blood pressure has improved.  His lactate has improved significantly as well.  Sepsis protocol has been initiated.  There is no clear source of infection at present.  It is felt the patient is now safe to be admitted to a stepdown unit but he will require close hemodynamic monitoring and ongoing volume resuscitation.  The patient has been accepted by the [facility] for admission to a stepdown bed at either [facility] or [facility], whichever facility has a bed sooner.  The Patient Placement RN will need to be notified when the patient arrives on the floor so the proper admitting [facility] MD can be contacted.  Lonia Blood, MD [facility] Office  [phone]  07/04/2021, 3:11 PM

## 2021-07-04 NOTE — ED Notes (Signed)
EKG obtained and given to physician. Patient hypotensive at arrival, charge in room as well as Augusto Gamble, Charity fundraiser. MD made aware of BP.

## 2021-07-05 LAB — COMPREHENSIVE METABOLIC PANEL
ALT: 15 U/L (ref 0–44)
AST: 66 U/L — ABNORMAL HIGH (ref 15–41)
Albumin: 3.3 g/dL — ABNORMAL LOW (ref 3.5–5.0)
Alkaline Phosphatase: 66 U/L (ref 38–126)
Anion gap: 12 (ref 5–15)
BUN: 42 mg/dL — ABNORMAL HIGH (ref 6–20)
CO2: 23 mmol/L (ref 22–32)
Calcium: 7.8 mg/dL — ABNORMAL LOW (ref 8.9–10.3)
Chloride: 100 mmol/L (ref 98–111)
Creatinine, Ser: 3.39 mg/dL — ABNORMAL HIGH (ref 0.61–1.24)
GFR, Estimated: 20 mL/min — ABNORMAL LOW (ref >60)
Glucose, Bld: 108 mg/dL — ABNORMAL HIGH (ref 70–99)
Potassium: 3.7 mmol/L (ref 3.5–5.1)
Sodium: 135 mmol/L (ref 135–145)
Total Bilirubin: 0.9 mg/dL (ref 0.3–1.2)
Total Protein: 6.6 g/dL (ref 6.5–8.1)

## 2021-07-05 LAB — CBC
HCT: 36.6 % — ABNORMAL LOW (ref 39.0–52.0)
HCT: 31.2 % — ABNORMAL LOW (ref 39.0–52.0)
Hemoglobin: 12.1 g/dL — ABNORMAL LOW (ref 13.0–17.0)
Hemoglobin: 10.7 g/dL — ABNORMAL LOW (ref 13.0–17.0)
MCH: 30.9 pg (ref 26.0–34.0)
MCH: 30.5 pg (ref 26.0–34.0)
MCHC: 33.1 g/dL (ref 30.0–36.0)
MCHC: 34.3 g/dL (ref 30.0–36.0)
MCV: 93.6 fL (ref 80.0–100.0)
MCV: 88.9 fL (ref 80.0–100.0)
Platelets: 432 10*3/uL — ABNORMAL HIGH (ref 150–400)
Platelets: 272 10*3/uL (ref 150–400)
RBC: 3.91 MIL/uL — ABNORMAL LOW (ref 4.22–5.81)
RBC: 3.51 MIL/uL — ABNORMAL LOW (ref 4.22–5.81)
RDW: 13.5 % (ref 11.5–15.5)
RDW: 13.2 % (ref 11.5–15.5)
WBC: 50.5 10*3/uL (ref 4.0–10.5)
WBC: 21 10*3/uL — ABNORMAL HIGH (ref 4.0–10.5)
nRBC: 0 % (ref 0.0–0.2)
nRBC: 0.1 % (ref 0.0–0.2)

## 2021-07-05 LAB — URINE CULTURE: Culture: NO GROWTH

## 2021-07-05 LAB — HIV ANTIBODY (ROUTINE TESTING W REFLEX): HIV Screen 4th Generation wRfx: NONREACTIVE

## 2021-07-05 LAB — PROTIME-INR
INR: 1.2 (ref 0.8–1.2)
Prothrombin Time: 14.9 seconds (ref 11.4–15.2)

## 2021-07-05 LAB — MRSA NEXT GEN BY PCR, NASAL: MRSA by PCR Next Gen: NOT DETECTED

## 2021-07-05 MED ORDER — AMLODIPINE BESYLATE 5 MG PO TABS
5.0000 mg | ORAL_TABLET | Freq: Every day | ORAL | Status: DC
  Administered 2021-07-05 – 2021-07-06 (×2): 5 mg via ORAL
  Filled 2021-07-05 (×2): qty 1

## 2021-07-05 MED ORDER — LACTATED RINGERS IV SOLN: INTRAVENOUS | Status: DC

## 2021-07-05 MED ORDER — PHENOL 1.4 % MT LIQD
1.0000 | OROMUCOSAL | Status: DC | PRN
  Administered 2021-07-05: 1 via OROMUCOSAL
  Filled 2021-07-05: qty 177

## 2021-07-05 NOTE — Plan of Care (Signed)

## 2021-07-05 NOTE — Progress Notes (Addendum)
PROGRESS NOTE   Terry Peters  PPI:[id number] DOB: [birth year] DOA: 07/04/2021 [facility]: [facility]  Brief Narrative:    61 y/o comm dwelling male former veteran Prior heavy EtOH and alcohol dependence Hypertension Remote DVT history   3-day history of 4-5 episodes of vomiting--in the setting of drinking alcohol last on 06/30/2021 Patient very hypotensive to 70 systolic blood pressure on arrival to [facility]-received 6 L total of fluids Had a incontinent liquid brown stool X3 times while he was in the ED   Tells me he drinks 1/5 of Hennessy occasionally and last took this on Wednesday-no ill contacts no outside food no exotic pets no rash Describes also an episode of going to the bathroom where he just felt "dizzy and did not remember a whole lot after that he fell himself on the ground" This happened a second time when he went to the bathroom and that is the main reason for coming to the emergency room  Hospital-Problem based course  hyperleukocytosis 50,000 on admission with lactic acidosis of 9 on admission Likely combination is diarrhea superimposed on acute EtOH in addition to some underlying  Hematological anomaly Morphology cells is normal however and platelets are normal I do not think this is infectious-see below--d/w Dr. Zenovia Jarred have CBC in 2 weeks Profound volume depletion/ATN secondary to diarrheal illness?  Combined with recent EtOH + lisinopril HCTZ Anion gap acidosis on admission Hyperkalemia on admission Majority of lab improved with fluid resuscitation Continue IV fluid but cut back rate to 100 cc/H When discharged will need amlodipine dose-do not place back on combine HTN med Resolved likely gastroenteritis gastritis? Discontinue contact precaution Unclear etiology-do not need vancomycin going forward Probably discontinue cefepime Flagyl a.m. Remote history DVT Can continue aspirin as an outpatient Type II demand ischemia secondary to decreased  tissue perfusion and acidosis on admission No chest pain-no further work-up at this time Ethanolism, drinks 1/5 of Hennessy occasionally  CIWA score normalizing-patient is more stable at this time and can probably discontinue protocol in Am LFT going down Can transfer to less intense level of care   DVT prophylaxis: Heparin Code Status: Full Family Communication: None Disposition:  Status is: Inpatient  Remains inpatient appropriate because: Lab abnormalities AKI       Consultants:  Telephone consult Dr. Mary Sella Labs OP ion 2 weeks  Procedures:   Antimicrobials:     Subjective: Awake coherent in nad no focal deficit Feels better Polyuric 1 smear semi-formed stool today No cp  Objective: Vitals:   07/05/21 1100 07/05/21 1130 07/05/21 1200 07/05/21 1300  BP:   (!) 160/122 (!) 145/81  Pulse: 100  (!) 106 (!) 103  Resp: (!) 29  (!) 21 14  Temp:  98.2 F (36.8 C)    TempSrc:  Oral    SpO2: 94%  95% 99%  Weight:      Height:        Intake/Output Summary (Last 24 hours) at 07/05/2021 1418 Last data filed at 07/05/2021 1300 Gross per 24 hour  Intake 4291.49 ml  Output 4100 ml  Net 191.49 ml   Filed Weights   07/04/21 0756  Weight: 83.9 kg    Examination:  Awake alert coherent no distress EOMI NCAT no focal to CTA B no added sound no rales no rhonchi Thick neck Mallampati S1-S2 no murmur no rub or gallop ROM intact Power 5/5   Data Reviewed: personally reviewed   CBC    Component Value Date/Time   WBC 21.0 (H)  07/05/2021 0256   RBC 3.51 (L) 07/05/2021 0256   HGB 10.7 (L) 07/05/2021 0256   HCT 31.2 (L) 07/05/2021 0256   PLT 272 07/05/2021 0256   MCV 88.9 07/05/2021 0256   MCH 30.5 07/05/2021 0256   MCHC 34.3 07/05/2021 0256   RDW 13.2 07/05/2021 0256   LYMPHSABS 3.0 07/04/2021 0800   MONOABS 1.5 (H) 07/04/2021 0800   EOSABS 0.0 07/04/2021 0800   BASOSABS 0.5 (H) 07/04/2021 0800   CMP Latest Ref Rng & Units 07/05/2021 07/04/2021 07/04/2021   Glucose 70 - 99 mg/dL 161(W108(H) 960(A172(H) 540(J211(H)  BUN 6 - 20 mg/dL 81(X42(H) 91(Y47(H) 78(G46(H)  Creatinine 0.61 - 1.24 mg/dL 9.56(O3.39(H) 1.30(Q4.98(H) 6.57(Q6.44(H)  Sodium 135 - 145 mmol/L 135 130(L) 133(L)  Potassium 3.5 - 5.1 mmol/L 3.7 4.3 5.9(H)  Chloride 98 - 111 mmol/L 100 95(L) 88(L)  CO2 22 - 32 mmol/L 23 20(L) 12(L)  Calcium 8.9 - 10.3 mg/dL 7.8(L) 6.5(L) 7.6(L)  Total Protein 6.5 - 8.1 g/dL 6.6 - 7.7  Total Bilirubin 0.3 - 1.2 mg/dL 0.9 - 1.2  Alkaline Phos 38 - 126 U/L 66 - 83  AST 15 - 41 U/L 66(H) - 99(H)  ALT 0 - 44 U/L 15 - 19     Radiology Studies: CT Abdomen Pelvis Wo Contrast  Result Date: 07/04/2021 CLINICAL DATA:  Abdominal pain, nausea, vomiting EXAM: CT ABDOMEN AND PELVIS WITHOUT CONTRAST TECHNIQUE: Multidetector CT imaging of the abdomen and pelvis was performed following the standard protocol without IV contrast. RADIATION DOSE REDUCTION: This exam was performed according to the departmental dose-optimization program which includes automated exposure control, adjustment of the mA and/or kV according to patient size and/or use of iterative reconstruction technique. COMPARISON:  08/15/2020 FINDINGS: Lower chest: No acute abnormality. Hepatobiliary: No solid liver abnormality is seen. No gallstones, gallbladder wall thickening, or biliary dilatation. Pancreas: Unremarkable. No pancreatic ductal dilatation or surrounding inflammatory changes. Spleen: Normal in size without significant abnormality. Adrenals/Urinary Tract: Adrenal glands are unremarkable. New bilateral perinephric fat stranding. Small nonobstructive calculus of the inferior pole of left kidney. No right-sided calculi, ureteral calculi, or hydronephrosis. Bladder is unremarkable. Stomach/Bowel: Stomach is within normal limits. Appendix appears normal. No evidence of bowel wall thickening, distention, or inflammatory changes. Colon is fluid-filled to the rectum. Vascular/Lymphatic: Aortic atherosclerosis. No enlarged abdominal or pelvic lymph  nodes. Reproductive: No mass or other significant abnormality. Other: No abdominal wall hernia or abnormality. No ascites. Musculoskeletal: No acute or significant osseous findings. IMPRESSION: 1. Colon is fluid-filled to the rectum, suggestive of diarrheal illness. 2. New bilateral perinephric fat stranding, suggesting infection or inflammation. Correlate with urinalysis. 3. Small nonobstructive calculus of the inferior pole of left kidney. No right-sided calculi, ureteral calculi, or hydronephrosis. Aortic Atherosclerosis (ICD10-I70.0). Electronically Signed   By: Jearld LeschAlex D Bibbey M.D.   On: 07/04/2021 13:05   DG Chest Port 1 View  Result Date: 07/04/2021 CLINICAL DATA:  61 year old male with possible sepsis. EXAM: PORTABLE CHEST 1 VIEW COMPARISON:  Chest CTA 03/03/2014 and earlier. FINDINGS: Portable AP semi upright view at 0824 hours. Mildly rotated to the right. Lung volumes and mediastinal contours remain within normal limits. Visualized tracheal air column is within normal limits. Allowing for portable technique the lungs are clear. No pneumothorax or pleural effusion. No acute osseous abnormality identified. Visible bowel-gas within normal limits. IMPRESSION: Negative portable chest. Electronically Signed   By: Odessa FlemingH  Hall M.D.   On: 07/04/2021 08:34     Scheduled Meds:  Chlorhexidine Gluconate Cloth  6 each Topical  Daily   folic acid  1 mg Oral Daily   heparin  5,000 Units Subcutaneous Q8H   mouth rinse  15 mL Mouth Rinse BID   multivitamin with minerals  1 tablet Oral Daily   ondansetron  4 mg Intravenous Once   ondansetron  4 mg Oral Q6H   thiamine  100 mg Oral Daily   Or   thiamine  100 mg Intravenous Daily   vancomycin variable dose per unstable renal function (pharmacist dosing)   Does not apply See admin instructions   Continuous Infusions:  sodium chloride Stopped (07/04/21 2245)   ceFEPime (MAXIPIME) IV Stopped (07/05/21 1013)   metronidazole Stopped (07/05/21 1146)     LOS: 1 day    Time spent: 14  Rhetta Mura, MD [facility] To contact the attending provider between 7A-7P or the covering provider during after hours 7P-7A, please log into the web site [email or web address] and access using universal [place] password for that web site. If you do not have the password, please call the hospital operator.  07/05/2021, 2:18 PM

## 2021-07-06 LAB — COMPREHENSIVE METABOLIC PANEL
ALT: 10 U/L (ref 0–44)
AST: 33 U/L (ref 15–41)
Albumin: 3.1 g/dL — ABNORMAL LOW (ref 3.5–5.0)
Alkaline Phosphatase: 59 U/L (ref 38–126)
Anion gap: 9 (ref 5–15)
BUN: 22 mg/dL — ABNORMAL HIGH (ref 6–20)
CO2: 27 mmol/L (ref 22–32)
Calcium: 8.2 mg/dL — ABNORMAL LOW (ref 8.9–10.3)
Chloride: 100 mmol/L (ref 98–111)
Creatinine, Ser: 1.59 mg/dL — ABNORMAL HIGH (ref 0.61–1.24)
GFR, Estimated: 49 mL/min — ABNORMAL LOW (ref >60)
Glucose, Bld: 112 mg/dL — ABNORMAL HIGH (ref 70–99)
Potassium: 3.1 mmol/L — ABNORMAL LOW (ref 3.5–5.1)
Sodium: 136 mmol/L (ref 135–145)
Total Bilirubin: 0.8 mg/dL (ref 0.3–1.2)
Total Protein: 6.5 g/dL (ref 6.5–8.1)

## 2021-07-06 LAB — CBC WITH DIFFERENTIAL/PLATELET
Abs Immature Granulocytes: 0.05 10*3/uL (ref 0.00–0.07)
Basophils Absolute: 0 10*3/uL (ref 0.0–0.1)
Basophils Relative: 0 %
Eosinophils Absolute: 0.1 10*3/uL (ref 0.0–0.5)
Eosinophils Relative: 1 %
HCT: 29.4 % — ABNORMAL LOW (ref 39.0–52.0)
Hemoglobin: 10 g/dL — ABNORMAL LOW (ref 13.0–17.0)
Immature Granulocytes: 1 %
Lymphocytes Relative: 19 %
Lymphs Abs: 2.1 10*3/uL (ref 0.7–4.0)
MCH: 30.7 pg (ref 26.0–34.0)
MCHC: 34 g/dL (ref 30.0–36.0)
MCV: 90.2 fL (ref 80.0–100.0)
Monocytes Absolute: 1.1 10*3/uL — ABNORMAL HIGH (ref 0.1–1.0)
Monocytes Relative: 11 %
Neutro Abs: 7.3 10*3/uL (ref 1.7–7.7)
Neutrophils Relative %: 68 %
Platelets: 188 10*3/uL (ref 150–400)
RBC: 3.26 MIL/uL — ABNORMAL LOW (ref 4.22–5.81)
RDW: 13.3 % (ref 11.5–15.5)
WBC: 10.7 10*3/uL — ABNORMAL HIGH (ref 4.0–10.5)
nRBC: 0 % (ref 0.0–0.2)

## 2021-07-06 LAB — VANCOMYCIN, RANDOM: Vancomycin Rm: 9

## 2021-07-06 MED ORDER — POTASSIUM CHLORIDE CRYS ER 20 MEQ PO TBCR
40.0000 meq | EXTENDED_RELEASE_TABLET | Freq: Two times a day (BID) | ORAL | Status: DC
  Administered 2021-07-06: 11:00:00 40 meq via ORAL
  Filled 2021-07-06: qty 2

## 2021-07-06 MED ORDER — POTASSIUM CHLORIDE CRYS ER 20 MEQ PO TBCR: 20.0000 meq | EXTENDED_RELEASE_TABLET | Freq: Every day | ORAL | 0 refills | Status: AC

## 2021-07-06 MED ORDER — AMLODIPINE BESYLATE 5 MG PO TABS: 5.0000 mg | ORAL_TABLET | Freq: Every day | ORAL | 3 refills | Status: AC

## 2021-07-06 NOTE — Hospital Course (Signed)
Mr. Matsuura is a 61 y.o. M with hx alcohol use, remote VTE and HTN not currently on treatment, who presented with recurrent osthostatic syncope after few days of vomiting severely at home.  In the ER, BP 78 systolic, tachycardic, lactic acid elevated.  WBC 50K.

## 2021-07-06 NOTE — TOC Transition Note (Signed)
Transition of Care [facility]) - CM/SW Discharge Note   Patient Details  Name: Terry Peters MRN: [id number] Date of Birth: 1960-08-08  Transition of Care [facility]) CM/SW Contact:  Darleene Cleaver, LCSW Phone Number: 07/06/2021, 1:11 PM   Clinical Narrative:     Substance abuse resources added to AVS.  Patient discharging back home.    Barriers to Discharge: Barriers Resolved   Patient Goals and CMS Choice Patient states their goals for this hospitalization and ongoing recovery are:: To return back home.      Discharge Placement                       Discharge Plan and Services                                     Social Determinants of Health (SDOH) Interventions     Readmission Risk Interventions No flowsheet data found.

## 2021-07-06 NOTE — Discharge Instructions (Signed)
Outpatient Substance Use Treatment Services   [facility] Out[facility] [address] [place], [place] [address]  [phone] Private insurance, Medicare A&B, and GCCN   [facility]  [address]  [place], [place] [address] [phone] Medicaid, Self Pay   [facility]      [address]  [place], [place] [phone] Medicaid and Private Insurance, Self Pay   [facility] [address]  [place], [place]  [phone] Private Insurance, and Self Pay  [facility]      [address]    [place], [place] [address]  [phone] or [phone] Private Insurance Only   [facility] [address]  [place], [place] [address] [phone] Medicaid, Medicare, Private Insurance  [facility] [address]  [place], [place] [address] [phone] Services are free or reduced  [facility]  [address] [phone]  Self Pay only, sliding scale  [facility]  [address]  [place], [place] [address] [phone] ([facility]) Self Pay, Medicaid Only   [facility] [address] [place] [address]  [place], [place] [address] [phone] Medicaid, Medicare, Private Insurance  Residential Substance Use Treatment Services   [facility]  [address]  [place], [place] [address]  [phone] or [phone] Detox (Medicare, Medicaid, private insurance, and self pay)  Residential Rehab 14 days (Medicare, Medicaid, private insurance, and self pay)   [facility]  [address] [place], [employer] [place]  [phone]  Male and Male Detox (Self Pay and Medicaid limited availability)  Rehab only Male (Medicaid and self pay only)   [facility]      [address]   [place], [place] [address]  [phone] or [phone] Detox and Residential Treatment Private Insurance Only   [facility]  [address]  [place], [place] [address]  [phone]  Treatment Only, must make assessment appointment, and must be sober for assessment appointment.  Self Pay Only, Medicare A&B, [place] Medicaid, [place] ID only! *Transportation assistance offered from [employer] [address]  [facility]     [address] [place], [place] [address] Walk in interviews M-Sat 8-4p No pending legal charges [phone]  [facility]:  [facility]  [address] [place], [place] [phone] (Self Pay, Medicaid)  [facility] [address] [place], [place] [address] [phone] Detox and Residential Treatment Medicare and Private Insurance  [facility] [address]  [place], [place] [address] 28 Day Women's Facility: [phone] 28 Day Men's Facility: [phone] Long-term Residential Program:  [phone] Males 25 and Over (No Insurance, upfront fee)  [facility]  [address] [place], [place] [address] [phone] Private Insurance with [employer], Private Pay  [facility] [address] [place], [place] [address] Local [phone] Private Insurance Only  [facility] [address]  , [place] [address]  [phone] (Males, upfront fee)  [facility] [place] [address]  [place], [address] [phone] Private Insurance   [facility] Locations  [facility]  [address]  [place], [place]  [phone] Christian Based Program for individuals experiencing homelessness Self Pay, No insurance  [facility]  Men's program: [facility] [address]  [place], [place] [address] [phone]  [facility] Women[facility]: [facility] [address]   [place], [place] [address] [phone] Christian Based Program for individuals  experiencing homelessness Self Pay, No insurance  [facility] [address]  [place], [place] [address]  [phone] Christian Based Program for individuals experiencing homelessness Self Pay, No insurance  [facility] [address]  [place], [place] [address] [phone] Christian Based Program for individuals experiencing homelessness Self Pay, No insurance  [facility] [address] [place], [place] [phone] Christian Based Program for males experiencing homelessness Self Pay, No insurance                    

## 2021-07-06 NOTE — Discharge Summary (Signed)
Physician Discharge Summary   Patient: Terry Peters MRN: [id number] DOB: [birth year]  Admit date:     07/04/2021  Discharge date: 07/06/21  Discharge Physician: Edwin Dada   PCP: [facility]   Recommendations at discharge:  Follow-up with PCP in 1 week PCP: Please check creatinine/K in 1 week, resume lisinopril/HCTZ if needed PCP: Please check CBC in 2 weeks follow WBC       Discharge Diagnoses Principal discharge problem Acute renal failure  Other discharge diagnoses Alcohol use disorder Leukemoid reaction Dehydration Elevated anion gap metabolic acidosis Hyperkalemia Gastroenteritis History of venous thromboembolism Demand ischemia       Hospital Course   Terry Peters is a 61 y.o. M with hx alcohol use, remote VTE and HTN not currently on treatment, who presented with recurrent osthostatic syncope after few days of vomiting severely at home.  In the ER, BP 78 systolic, tachycardic, lactic acid elevated.  WBC 50K.      Patient was started on IV fluids, and his renal function improved.  Urine output was good.  History consistent with vomiting prior to admission, poor oral intake, in the setting of lisinopril use.  Infectious work-up was negative, leukemoid reaction appeared to be stress related.  Discussed with oncology who recommended CBC in 2 weeks follow-up pending that.  On the day of discharge, the patient's creatinine was 1.6, he was taking p.o. well, and he was asymptomatic with ambulation.  Stable for discharge with PCP follow-up.            Disposition: Home Diet recommendation: Regular diet  DISCHARGE MEDICATION: Allergies as of 07/06/2021       Reactions   Other Hives, Shortness Of Breath   All Nuts--Almonds, pecans, etc.   Peanuts [peanut Oil] Hives, Shortness Of Breath   Strawberry Extract Hives        Medication List     STOP taking these medications    aspirin 81 MG chewable tablet    lisinopril-hydrochlorothiazide 20-25 MG tablet Commonly known as: ZESTORETIC   multivitamin with minerals Tabs tablet   ondansetron 4 MG tablet Commonly known as: ZOFRAN       TAKE these medications    amLODipine 5 MG tablet Commonly known as: NORVASC Take 1 tablet (5 mg total) by mouth daily.   folic acid 1 MG tablet Commonly known as: FOLVITE Take 1 tablet (1 mg total) by mouth daily.   potassium chloride SA 20 MEQ tablet Commonly known as: KLOR-CON M Take 1 tablet (20 mEq total) by mouth daily.   thiamine 100 MG tablet Take 1 tablet (100 mg total) by mouth daily.        Follow-up Information     [facility], [facility]. Schedule an appointment as soon as possible for a visit in 1 week(s).   Why: And get labs drawn Contact information: [place] [address] [id number]                Discharge Instructions     Discharge instructions   Complete by: As directed    From Dr. Loleta Books: You were admitted for kidney injury and dehydration. The dehydration may have been from a stomach bug, worsened by alcohol use. When you got dehydrated, this (combined with your medicine lisinopril) made you have kidney injury, near kidney failure.  Thankfully, your kidneys got better here with fluids.  Go home and PUSH fluids Make sure your urine is light yellow  Do NOT take your lisinopril-HCTZ for  blood pressure Also, do not take aspirin for now Take amlodipine 5 mg daily for blood pressure instead   Also, take a potassium supplement 20 mEq daily for the next week  Go see your primary care doctor in 1 week Have them check your blood pressure and kidney function and potassium level They may stop the amlodipine and switch back to lisinopril-HCTZ, ask them Ask them about aspirin too, you should restart that soon  Avoid alcohol   Increase activity slowly   Complete by: As directed        Discharge Exam: Filed Weights    07/04/21 0756  Weight: 83.9 kg   General: Pt is alert, awake, not in acute distress Cardiovascular: RRR, nl S1-S2, no murmurs appreciated.   No LE edema.   Respiratory: Normal respiratory rate and rhythm.  CTAB without rales or wheezes. Abdominal: Abdomen soft and non-tender.  No distension or HSM.   Neuro/Psych: Strength symmetric in upper and lower extremities.  Judgment and insight appear normal.   Condition at discharge: good  The results of significant diagnostics from this hospitalization (including imaging, microbiology, ancillary and laboratory) are listed below for reference.   Imaging Studies: CT Abdomen Pelvis Wo Contrast  Result Date: 07/04/2021 CLINICAL DATA:  Abdominal pain, nausea, vomiting EXAM: CT ABDOMEN AND PELVIS WITHOUT CONTRAST TECHNIQUE: Multidetector CT imaging of the abdomen and pelvis was performed following the standard protocol without IV contrast. RADIATION DOSE REDUCTION: This exam was performed according to the departmental dose-optimization program which includes automated exposure control, adjustment of the mA and/or kV according to patient size and/or use of iterative reconstruction technique. COMPARISON:  08/15/2020 FINDINGS: Lower chest: No acute abnormality. Hepatobiliary: No solid liver abnormality is seen. No gallstones, gallbladder wall thickening, or biliary dilatation. Pancreas: Unremarkable. No pancreatic ductal dilatation or surrounding inflammatory changes. Spleen: Normal in size without significant abnormality. Adrenals/Urinary Tract: Adrenal glands are unremarkable. New bilateral perinephric fat stranding. Small nonobstructive calculus of the inferior pole of left kidney. No right-sided calculi, ureteral calculi, or hydronephrosis. Bladder is unremarkable. Stomach/Bowel: Stomach is within normal limits. Appendix appears normal. No evidence of bowel wall thickening, distention, or inflammatory changes. Colon is fluid-filled to the rectum.  Vascular/Lymphatic: Aortic atherosclerosis. No enlarged abdominal or pelvic lymph nodes. Reproductive: No mass or other significant abnormality. Other: No abdominal wall hernia or abnormality. No ascites. Musculoskeletal: No acute or significant osseous findings. IMPRESSION: 1. Colon is fluid-filled to the rectum, suggestive of diarrheal illness. 2. New bilateral perinephric fat stranding, suggesting infection or inflammation. Correlate with urinalysis. 3. Small nonobstructive calculus of the inferior pole of left kidney. No right-sided calculi, ureteral calculi, or hydronephrosis. Aortic Atherosclerosis (ICD10-I70.0). Electronically Signed   By: Delanna Ahmadi M.D.   On: 07/04/2021 13:05   DG Chest Port 1 View  Result Date: 07/04/2021 CLINICAL DATA:  61 year old male with possible sepsis. EXAM: PORTABLE CHEST 1 VIEW COMPARISON:  Chest CTA 03/03/2014 and earlier. FINDINGS: Portable AP semi upright view at 0824 hours. Mildly rotated to the right. Lung volumes and mediastinal contours remain within normal limits. Visualized tracheal air column is within normal limits. Allowing for portable technique the lungs are clear. No pneumothorax or pleural effusion. No acute osseous abnormality identified. Visible bowel-gas within normal limits. IMPRESSION: Negative portable chest. Electronically Signed   By: Genevie Ann M.D.   On: 07/04/2021 08:34    Microbiology: Results for orders placed or performed during the hospital encounter of 07/04/21  Resp Panel by RT-PCR (Flu A&B, Covid) Nasopharyngeal Swab  Status: None   Collection Time: 07/04/21  8:00 AM   Specimen: Nasopharyngeal Swab; Nasopharyngeal(NP) swabs in vial transport medium  Result Value Ref Range Status   SARS Coronavirus 2 by RT PCR NEGATIVE NEGATIVE Final    Comment: (NOTE) SARS-CoV-2 target nucleic acids are NOT DETECTED.  The SARS-CoV-2 RNA is generally detectable in upper respiratory specimens during the acute phase of infection. The  lowest concentration of SARS-CoV-2 viral copies this assay can detect is 138 copies/mL. A negative result does not preclude SARS-Cov-2 infection and should not be used as the sole basis for treatment or other patient management decisions. A negative result may occur with  improper specimen collection/handling, submission of specimen other than nasopharyngeal swab, presence of viral mutation(s) within the areas targeted by this assay, and inadequate number of viral copies(<138 copies/mL). A negative result must be combined with clinical observations, patient history, and epidemiological information. The expected result is Negative.  Fact Sheet for Patients:  BloggerCourse.com  Fact Sheet for Healthcare Providers:  SeriousBroker.it  This test is no t yet approved or cleared by the [place] FDA and  has been authorized for detection and/or diagnosis of SARS-CoV-2 by FDA under an Emergency Use Authorization (EUA). This EUA will remain  in effect (meaning this test can be used) for the duration of the COVID-19 declaration under Section 564(b)(1) of the Act, 21 U.S.C.section 360bbb-3(b)(1), unless the authorization is terminated  or revoked sooner.       Influenza A by PCR NEGATIVE NEGATIVE Final   Influenza B by PCR NEGATIVE NEGATIVE Final    Comment: (NOTE) The Xpert Xpress SARS-CoV-2/FLU/RSV plus assay is intended as an aid in the diagnosis of influenza from Nasopharyngeal swab specimens and should not be used as a sole basis for treatment. Nasal washings and aspirates are unacceptable for Xpert Xpress SARS-CoV-2/FLU/RSV testing.  Fact Sheet for Patients: BloggerCourse.com  Fact Sheet for Healthcare Providers: SeriousBroker.it  This test is not yet approved or cleared by the [place] FDA and has been authorized for detection and/or diagnosis of SARS-CoV-2 by FDA under  an Emergency Use Authorization (EUA). This EUA will remain in effect (meaning this test can be used) for the duration of the COVID-19 declaration under Section 564(b)(1) of the Act, 21 U.S.C. section 360bbb-3(b)(1), unless the authorization is terminated or revoked.  Performed at [facility], [address] [place], [place] [address]   Blood Culture (routine x 2)     Status: None (Preliminary result)   Collection Time: 07/04/21  8:40 AM   Specimen: BLOOD RIGHT HAND  Result Value Ref Range Status   Specimen Description   Final    BLOOD RIGHT HAND Performed at [facility], [address] [place], [place] [address]    Special Requests   Final    BOTTLES DRAWN AEROBIC ONLY Blood Culture results may not be optimal due to an inadequate volume of blood received in culture bottles Performed at [facility], [address] [place], [place] [address]    Culture   Final    NO GROWTH 2 DAYS Performed at [facility], [address] [place], [place] [address]    Report Status PENDING  Incomplete  Blood Culture (routine x 2)     Status: None (Preliminary result)   Collection Time: 07/04/21  9:45 AM   Specimen: BLOOD RIGHT HAND  Result Value Ref Range Status   Specimen Description   Final  BLOOD RIGHT HAND Performed at [facility], [address] [place], [place] [address]    Special Requests   Final    BOTTLES DRAWN AEROBIC AND ANAEROBIC Blood Culture adequate volume Performed at [facility], [address] [place], [place] [address]    Culture   Final    NO GROWTH 2 DAYS Performed at [facility], [place] [address] [place], [place] [address]    Report Status PENDING  Incomplete  Urine Culture     Status: None   Collection Time: 07/04/21 11:15 AM   Specimen: In/Out Cath Urine  Result Value Ref Range Status   Specimen Description   Final    IN/OUT CATH URINE Performed at [facility], [address] [place], [place] [address]    Special Requests   Final    NONE Performed at [facility], [address] [place], [place] [address]    Culture   Final    NO GROWTH Performed at [facility], [place] [address], [place], [place] [address]    Report Status 07/05/2021 FINAL  Final  MRSA Next Gen by PCR, Nasal     Status: None   Collection Time: 07/04/21  5:02 PM   Specimen: Nasal Mucosa; Nasal Swab  Result Value Ref Range Status   MRSA by PCR Next Gen NOT DETECTED NOT DETECTED Final    Comment: (NOTE) The GeneXpert MRSA Assay (FDA approved for NASAL specimens only), is one component of a comprehensive MRSA colonization surveillance program. It is not intended to diagnose MRSA infection nor to guide or monitor treatment for MRSA infections. Test performance is not FDA approved in patients less than 26 years old. Performed at [facility], [place] [address] [place], [place] [address]     Labs: CBC: Recent Labs  Lab 07/04/21 0800 07/04/21 1342 07/05/21 0256 07/06/21 0246  WBC 50.5* 32.9* 21.0* 10.7*  NEUTROABS 45.5*  --   --  7.3  HGB 12.1* 10.0* 10.7* 10.0*  HCT 36.6* 28.4* 31.2* 29.4*  MCV 93.6 87.7 88.9 90.2  PLT 432* 297 272 0000000   Basic Metabolic Panel: Recent Labs  Lab 07/04/21 0800 07/04/21 1342 07/05/21 0256 07/06/21 0246  NA 133* 130* 135 136  K 5.9* 4.3 3.7 3.1*  CL 88* 95* 100 100  CO2 12* 20* 23 27  GLUCOSE 211* 172* 108* 112*  BUN 46* 47* 42* 22*  CREATININE 6.44* 4.98* 3.39* 1.59*  CALCIUM 7.6* 6.5* 7.8* 8.2*   Liver Function Tests: Recent Labs  Lab 07/04/21 0800 07/05/21 0256 07/06/21 0246  AST 99* 66* 33  ALT 19 15 10   ALKPHOS 83 66 59  BILITOT 1.2 0.9 0.8  PROT 7.7 6.6 6.5  ALBUMIN 3.8 3.3* 3.1*   CBG: Recent Labs  Lab 07/04/21 0816  GLUCAP 196*    Discharge time spent: 25 minutes.  Signed: Edwin Dada, MD [facility] 07/06/2021

## 2021-07-06 NOTE — Plan of Care (Signed)
°  Problem: Education: Goal: Knowledge of General Education information will improve Description: Including pain rating scale, medication(s)/side effects and non-pharmacologic comfort measures 07/06/2021 1319 by Ronnette Juniper, RN Outcome: Adequate for Discharge 07/06/2021 1236 by Ronnette Juniper, RN Outcome: Adequate for Discharge   Problem: Health Behavior/Discharge Planning: Goal: Ability to manage health-related needs will improve 07/06/2021 1319 by Ronnette Juniper, RN Outcome: Adequate for Discharge 07/06/2021 1236 by Ronnette Juniper, RN Outcome: Adequate for Discharge   Problem: Clinical Measurements: Goal: Ability to maintain clinical measurements within normal limits will improve 07/06/2021 1319 by Ronnette Juniper, RN Outcome: Adequate for Discharge 07/06/2021 1236 by Ronnette Juniper, RN Outcome: Adequate for Discharge Goal: Will remain free from infection 07/06/2021 1319 by Ronnette Juniper, RN Outcome: Adequate for Discharge 07/06/2021 1236 by Ronnette Juniper, RN Outcome: Adequate for Discharge Goal: Diagnostic test results will improve 07/06/2021 1319 by Ronnette Juniper, RN Outcome: Adequate for Discharge 07/06/2021 1236 by Ronnette Juniper, RN Outcome: Adequate for Discharge Goal: Respiratory complications will improve 07/06/2021 1319 by Ronnette Juniper, RN Outcome: Adequate for Discharge 07/06/2021 1236 by Ronnette Juniper, RN Outcome: Adequate for Discharge Goal: Cardiovascular complication will be avoided 07/06/2021 1319 by Ronnette Juniper, RN Outcome: Adequate for Discharge 07/06/2021 1236 by Ronnette Juniper, RN Outcome: Adequate for Discharge   Problem: Activity: Goal: Risk for activity intolerance will decrease 07/06/2021 1319 by Ronnette Juniper, RN Outcome: Adequate for Discharge 07/06/2021 1236 by Ronnette Juniper, RN Outcome: Adequate for Discharge   Problem: Nutrition: Goal: Adequate nutrition will be maintained 07/06/2021 1319 by Ronnette Juniper, RN Outcome: Adequate for  Discharge 07/06/2021 1236 by Ronnette Juniper, RN Outcome: Adequate for Discharge   Problem: Coping: Goal: Level of anxiety will decrease 07/06/2021 1319 by Ronnette Juniper, RN Outcome: Adequate for Discharge 07/06/2021 1236 by Ronnette Juniper, RN Outcome: Adequate for Discharge   Problem: Elimination: Goal: Will not experience complications related to bowel motility 07/06/2021 1319 by Ronnette Juniper, RN Outcome: Adequate for Discharge 07/06/2021 1236 by Ronnette Juniper, RN Outcome: Adequate for Discharge Goal: Will not experience complications related to urinary retention 07/06/2021 1319 by Ronnette Juniper, RN Outcome: Adequate for Discharge 07/06/2021 1236 by Ronnette Juniper, RN Outcome: Adequate for Discharge   Problem: Pain Managment: Goal: General experience of comfort will improve 07/06/2021 1319 by Ronnette Juniper, RN Outcome: Adequate for Discharge 07/06/2021 1236 by Ronnette Juniper, RN Outcome: Adequate for Discharge   Problem: Safety: Goal: Ability to remain free from injury will improve 07/06/2021 1319 by Ronnette Juniper, RN Outcome: Adequate for Discharge 07/06/2021 1236 by Ronnette Juniper, RN Outcome: Adequate for Discharge   Problem: Skin Integrity: Goal: Risk for impaired skin integrity will decrease 07/06/2021 1319 by Ronnette Juniper, RN Outcome: Adequate for Discharge 07/06/2021 1236 by Ronnette Juniper, RN Outcome: Adequate for Discharge   Problem: Fluid Volume: Goal: Hemodynamic stability will improve 07/06/2021 1319 by Ronnette Juniper, RN Outcome: Adequate for Discharge 07/06/2021 1236 by Ronnette Juniper, RN Outcome: Adequate for Discharge   Problem: Clinical Measurements: Goal: Diagnostic test results will improve 07/06/2021 1319 by Ronnette Juniper, RN Outcome: Adequate for Discharge 07/06/2021 1236 by Ronnette Juniper, RN Outcome: Adequate for Discharge Goal: Signs and symptoms of infection will decrease 07/06/2021 1319 by Ronnette Juniper, RN Outcome: Adequate for  Discharge 07/06/2021 1236 by Ronnette Juniper, RN Outcome: Adequate for Discharge   Problem: Respiratory: Goal: Ability to maintain adequate ventilation will improve 07/06/2021 1319 by Ronnette Juniper, RN Outcome: Adequate for Discharge 07/06/2021 1236 by Ronnette Juniper, RN Outcome: Adequate for Discharge

## 2021-07-07 LAB — PATHOLOGIST SMEAR REVIEW

## 2021-07-09 LAB — CULTURE, BLOOD (ROUTINE X 2)
Culture: NO GROWTH
Culture: NO GROWTH
Special Requests: ADEQUATE

## 2021-07-10 IMAGING — CT CT ABD-PELV W/ CM
2 of 5 series · 17 of 46 positions shown, 19 images · IV contrast (Omnipaque)
Comparison: CT 09/17/2014

CLINICAL DATA: Is lower abdominal pain.  Prior acute renal failure.

EXAM:
CT ABDOMEN AND PELVIS WITH CONTRAST
TECHNIQUE: Multidetector CT imaging of the abdomen and pelvis was performed
using the standard protocol following bolus administration of
intravenous contrast.
CONTRAST:  100mL OMNIPAQUE IOHEXOL 300 MG/ML  SOLN

[Series 2: axial st · axial · 0.79mm/px · z∈[+627,+1032]mm · 14 of 91 slices shown, 16 images]
[im 5/91  soft-tissue]
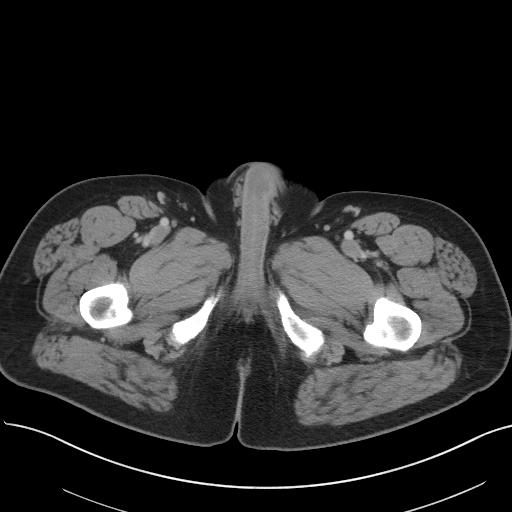
[im 5/91  bone]
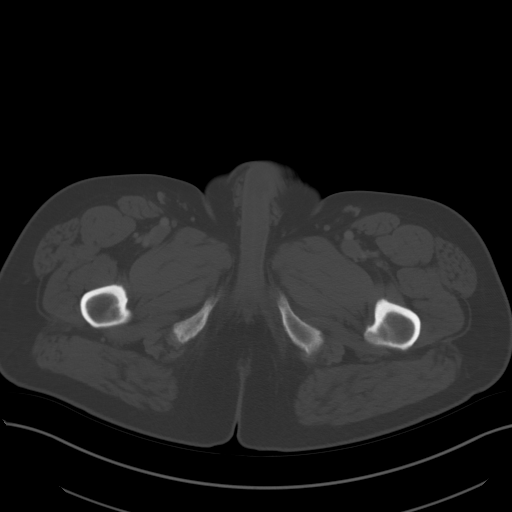
[im 10/91  soft-tissue]
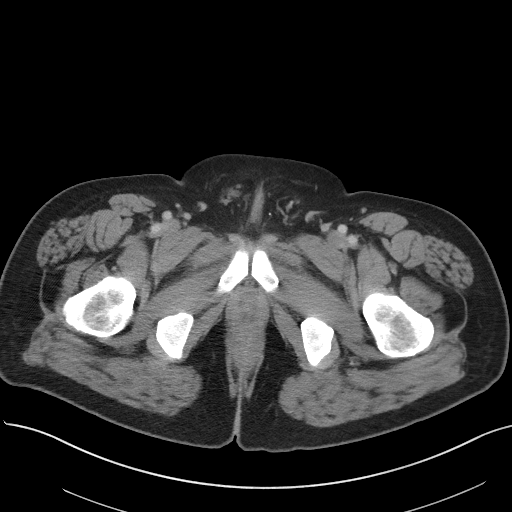
[im 19/91  soft-tissue]
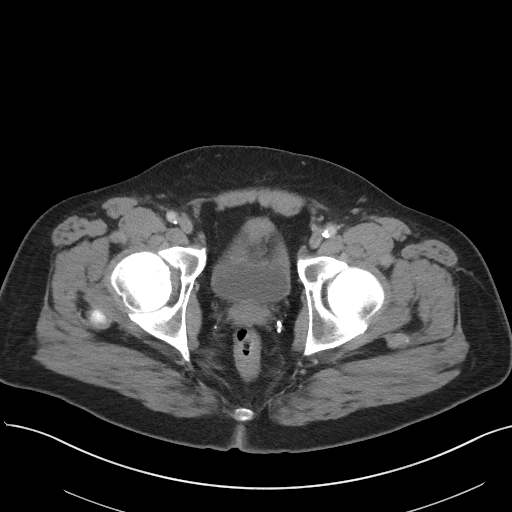
[im 24/91  soft-tissue]
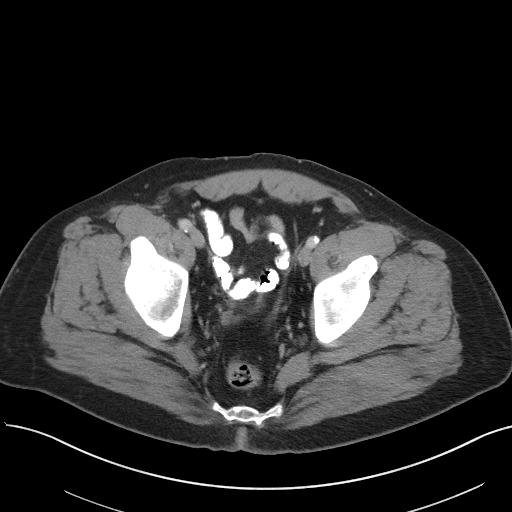
[im 29/91  soft-tissue]
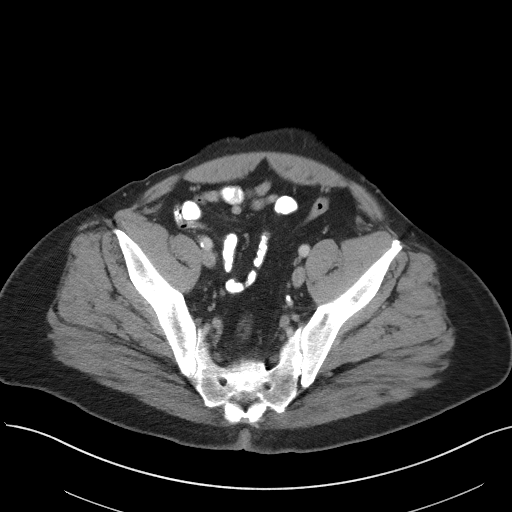
[im 38/91  soft-tissue]
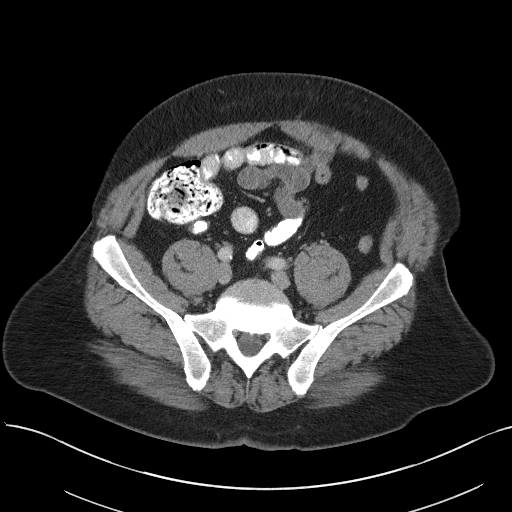
[im 43/91  soft-tissue]
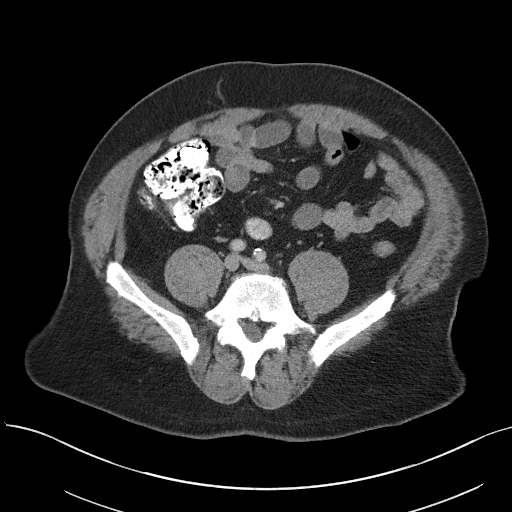
[im 48/91  soft-tissue]
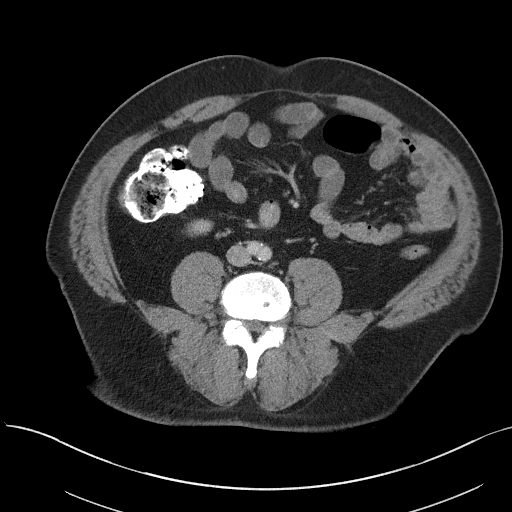
[im 53/91  soft-tissue]
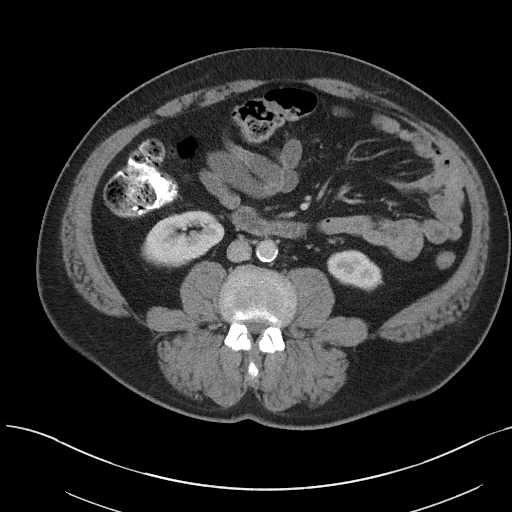
[im 53/91  bone]
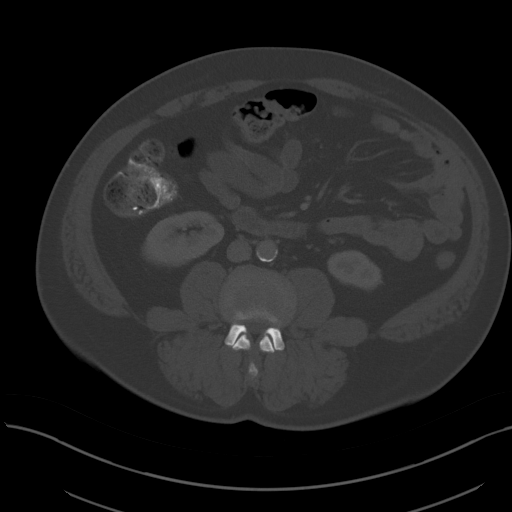
[im 62/91  soft-tissue]
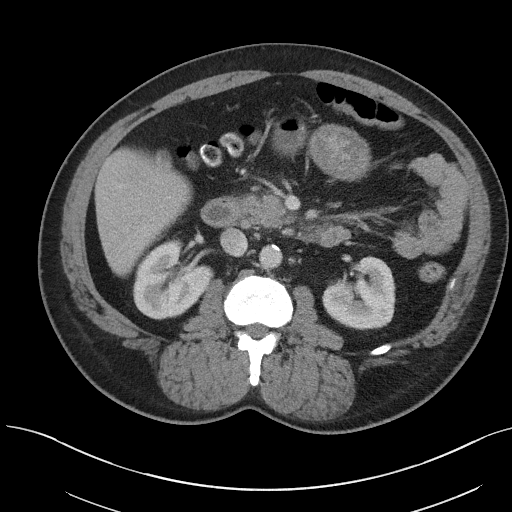
[im 67/91  soft-tissue]
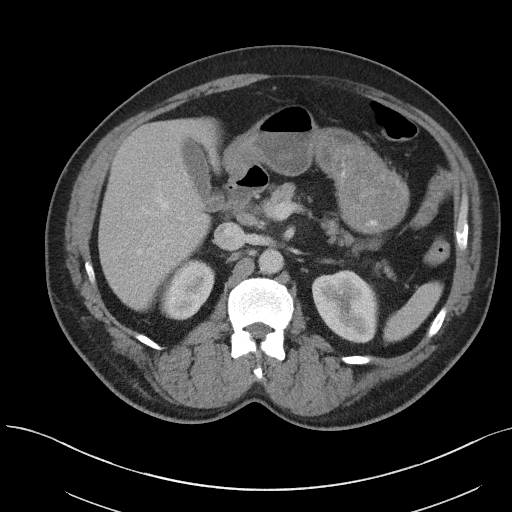
[im 72/91  soft-tissue]
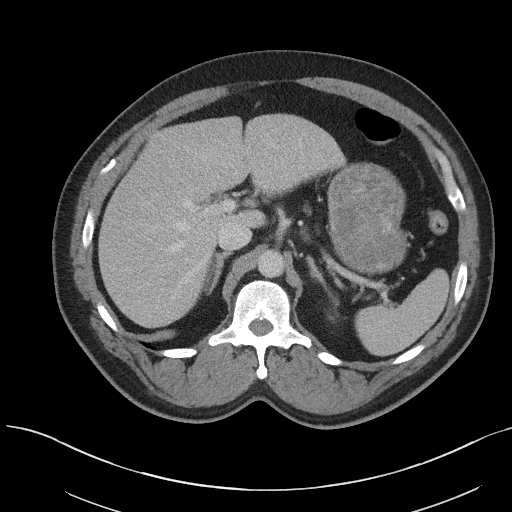
[im 81/91  soft-tissue]
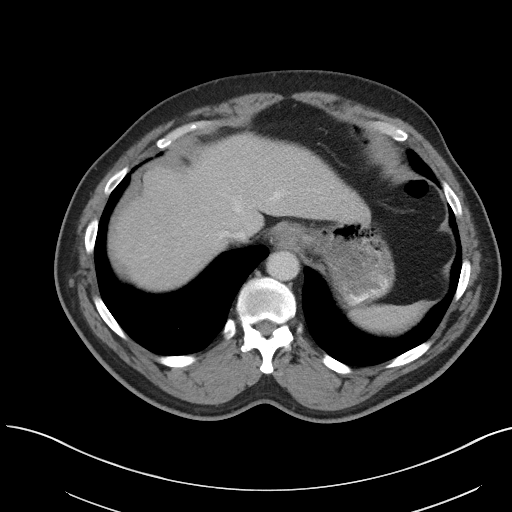
[im 86/91  soft-tissue]
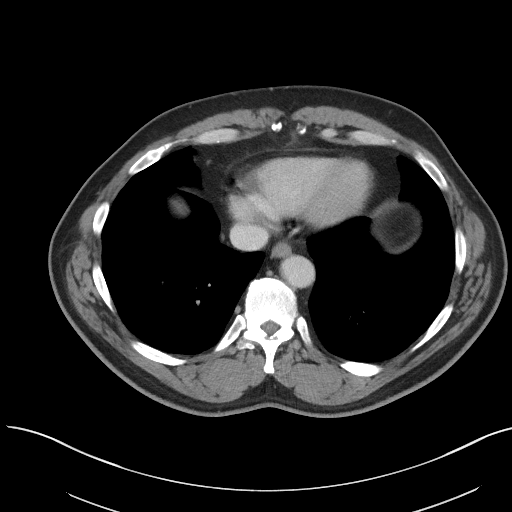

[Series 5: coronal st · coronal · 0.80mm/px · 3 of 103 slices shown]
[im 35/103  soft-tissue]
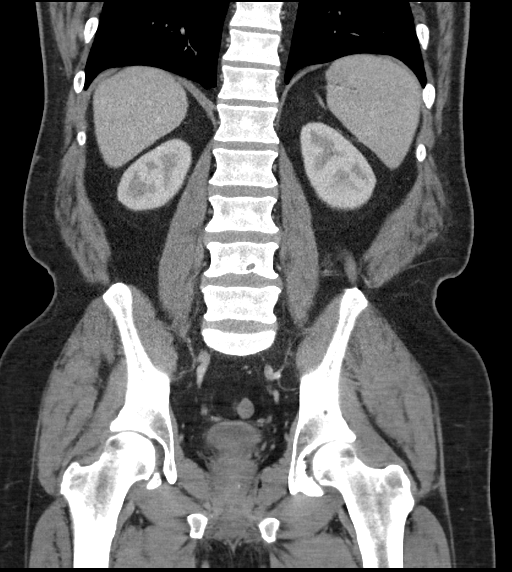
[im 46/103  soft-tissue]
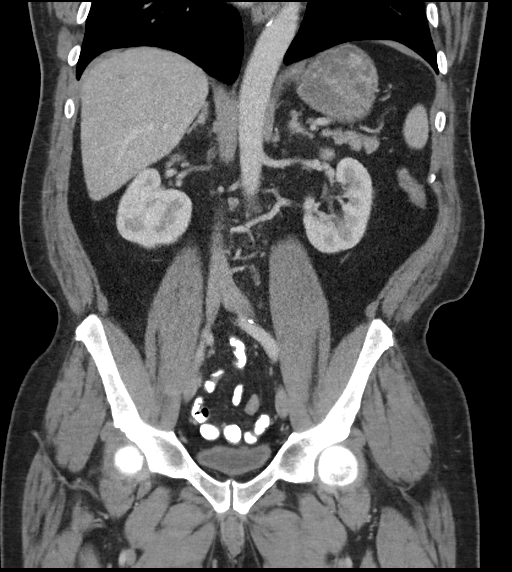
[im 57/103  soft-tissue]
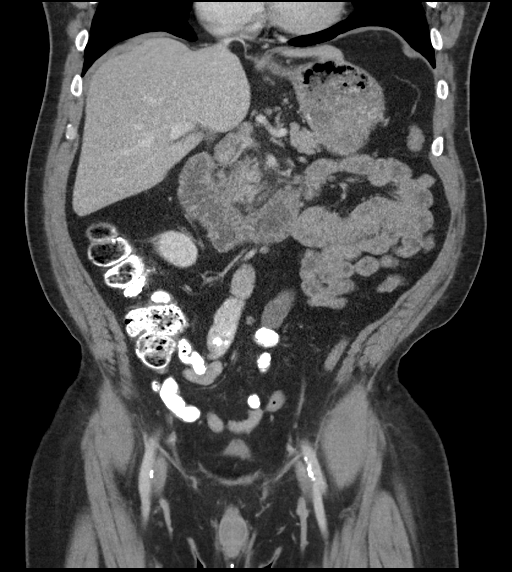

[17 of 46 positions shown; findings below may reference images not displayed]

FINDINGS: Lower chest: Lung bases are clear.

Hepatobiliary: No focal hepatic lesion. No biliary duct dilatation.
Common bile duct is normal.

Pancreas: There is mild haziness within the fat adjacent to the
uncinate and head of the pancreas (image 25-35 of series 2). The
body and tail the pancreas normal. No pancreatic duct dilatation. No
common bile duct dilatation.

Spleen: Normal spleen

Adrenals/urinary tract: Adrenal glands and kidneys are normal. The
ureters and bladder normal.

Stomach/Bowel: Stomach, duodenum small-bowel normal. Terminal ileum
normal. Appendix normal. LEFT colon and rectosigmoid colon normal.

Vascular/Lymphatic: Abdominal aorta is normal caliber with
atherosclerotic calcification. There is no retroperitoneal or
periportal lymphadenopathy. No pelvic lymphadenopathy.

Reproductive: Prostate normal

Other: No free fluid.

Musculoskeletal: No aggressive osseous lesion.
IMPRESSION: 1. Mild inflammation and edema of the pancreatic head and uncinate
suggests focal acute pancreatitis.
2. No radiodense gallstones evident. The gallbladder is
nondistended. No common bile duct dilatation.
3. No diverticulosis.

## 2022-05-29 IMAGING — DX DG CHEST 1V PORT
1 series · 1 of 1 positions shown · non-contrast
Comparison: Chest CTA 03/03/2014 and earlier.

CLINICAL DATA: 60-year-old male with possible sepsis.

EXAM:
PORTABLE CHEST 1 VIEW

[chest ap]
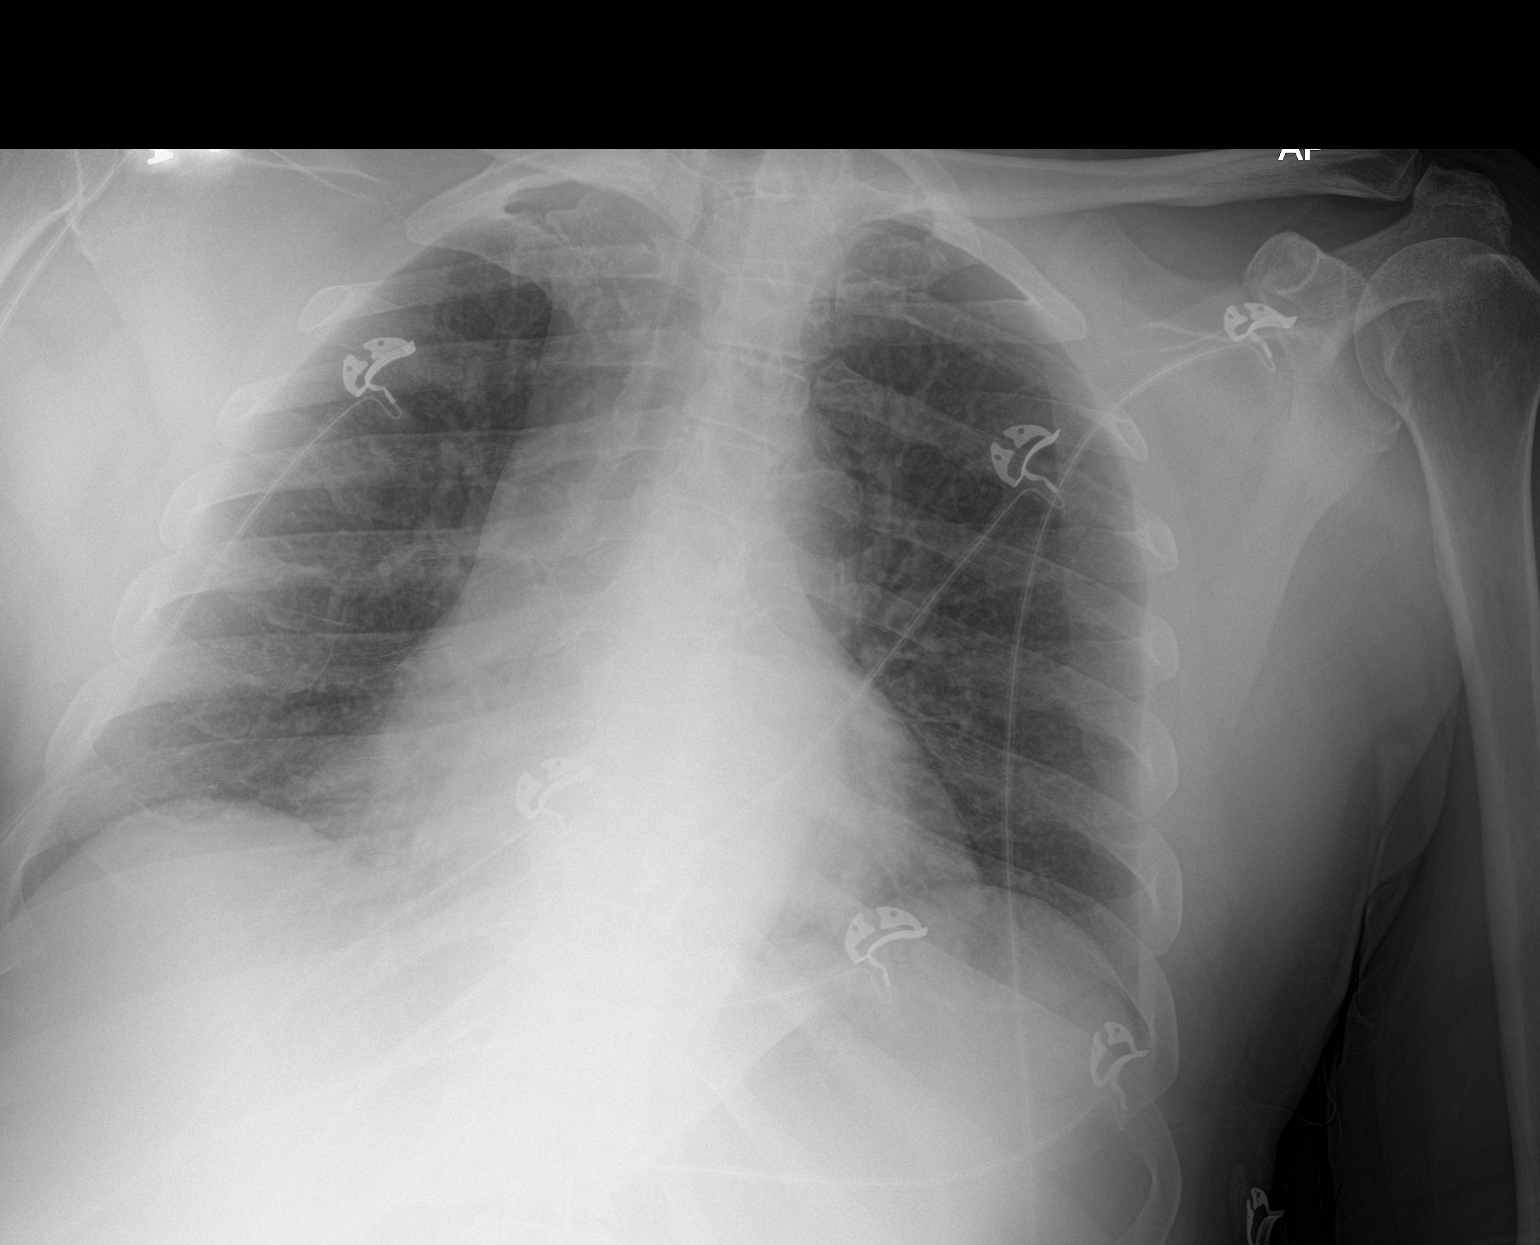

[1 of 1 positions shown; findings below may reference images not displayed]

FINDINGS: Portable AP semi upright view at 6044 hours. Mildly rotated to the
right. Lung volumes and mediastinal contours remain within normal
limits. Visualized tracheal air column is within normal limits.
Allowing for portable technique the lungs are clear. No pneumothorax
or pleural effusion. No acute osseous abnormality identified.
Visible bowel-gas within normal limits.
IMPRESSION: Negative portable chest.

## 2022-05-29 IMAGING — CT CT ABD-PELV W/O CM
2 of 4 series · 15 of 46 positions shown, 17 images · non-contrast
Comparison: 08/15/2020

CLINICAL DATA: Abdominal pain, nausea, vomiting



[Series 2: axial st · axial · 0.94mm/px · z∈[-382,+83]mm · 12 of 103 slices shown, 14 images]
[im 5/103  soft-tissue]
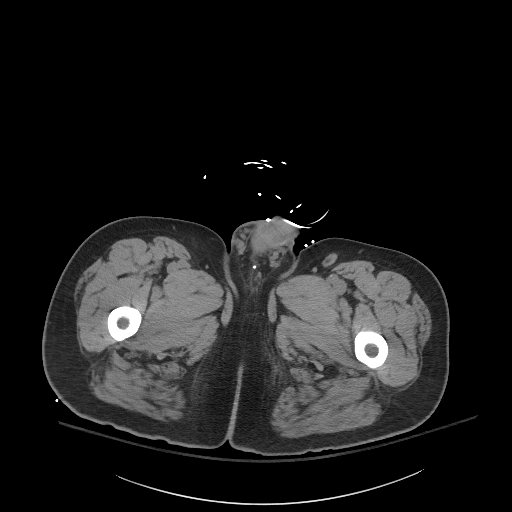
[im 5/103  bone]
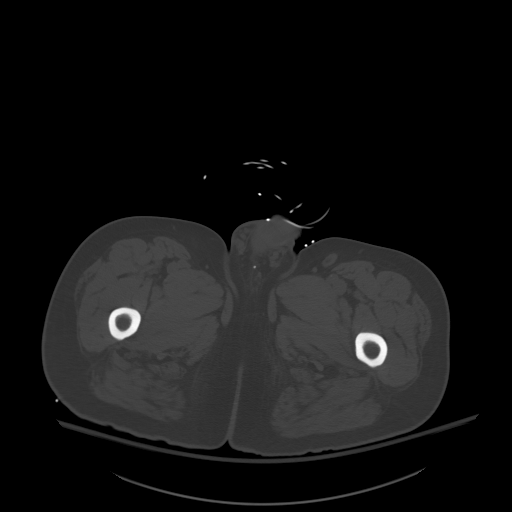
[im 14/103  soft-tissue]
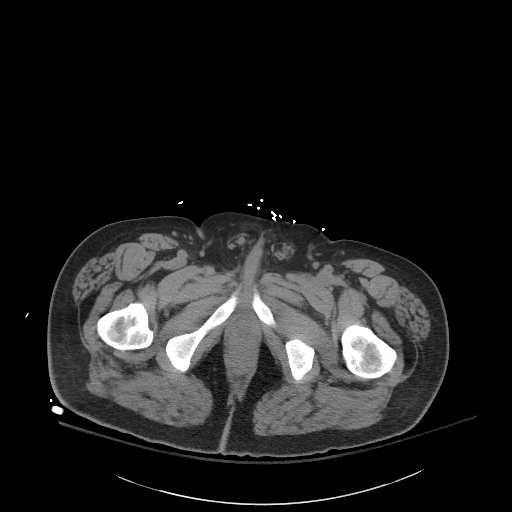
[im 23/103  soft-tissue]
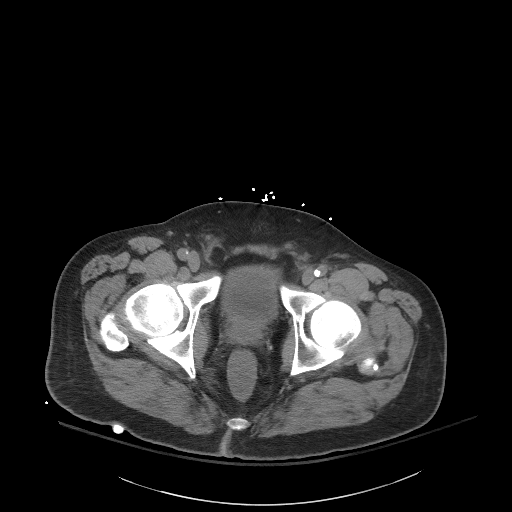
[im 32/103  soft-tissue]
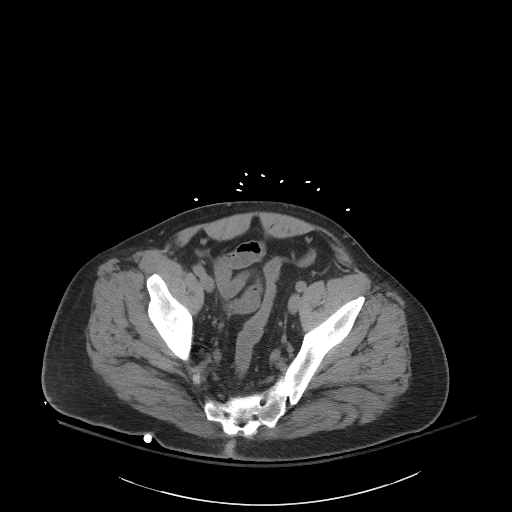
[im 40/103  soft-tissue]
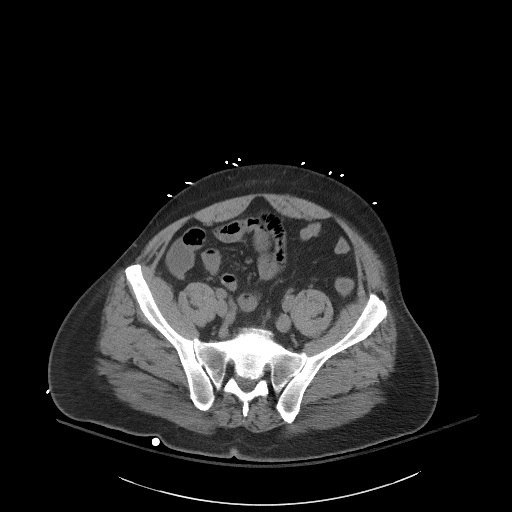
[im 49/103  soft-tissue]
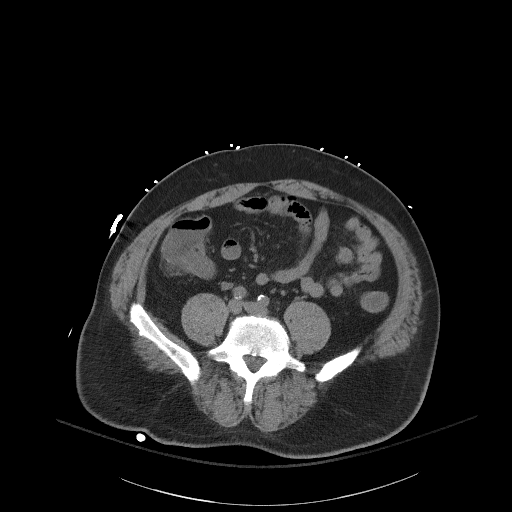
[im 54/103  soft-tissue]
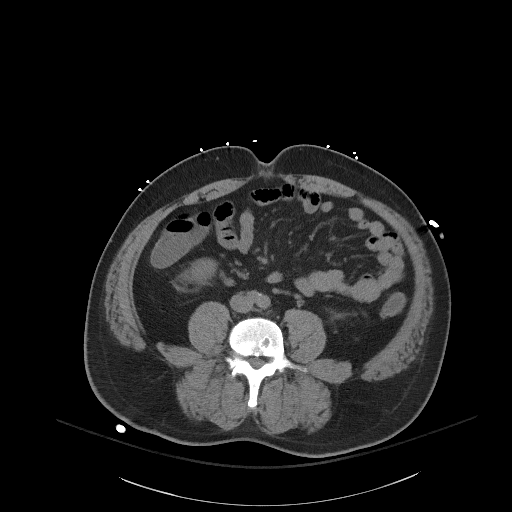
[im 63/103  soft-tissue]
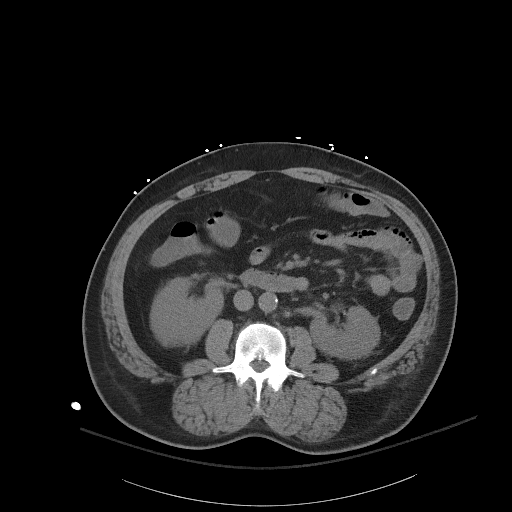
[im 71/103  soft-tissue]
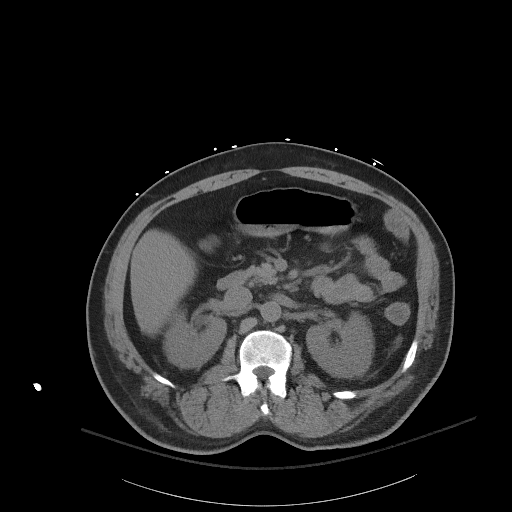
[im 71/103  bone]
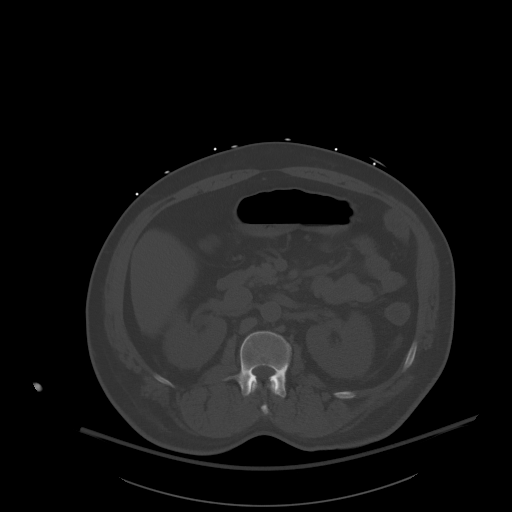
[im 80/103  soft-tissue]
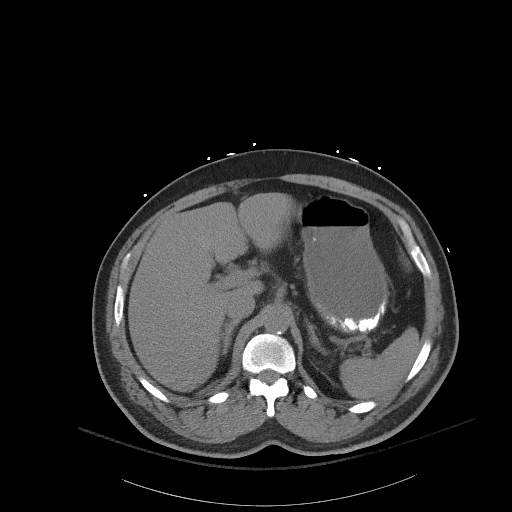
[im 89/103  soft-tissue]
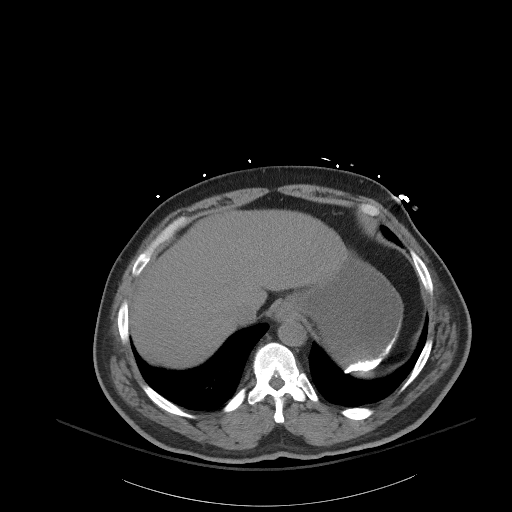
[im 98/103  soft-tissue]
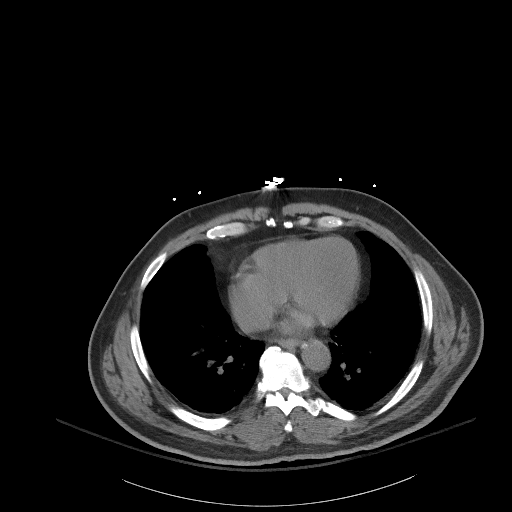

[Series 5: coronal st · coronal · 0.69mm/px · 3 of 104 slices shown]
[im 35/104  soft-tissue]
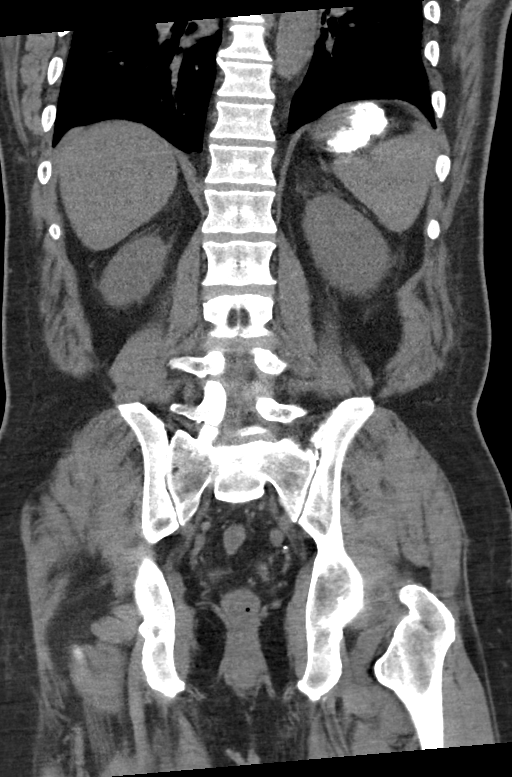
[im 46/104  soft-tissue]
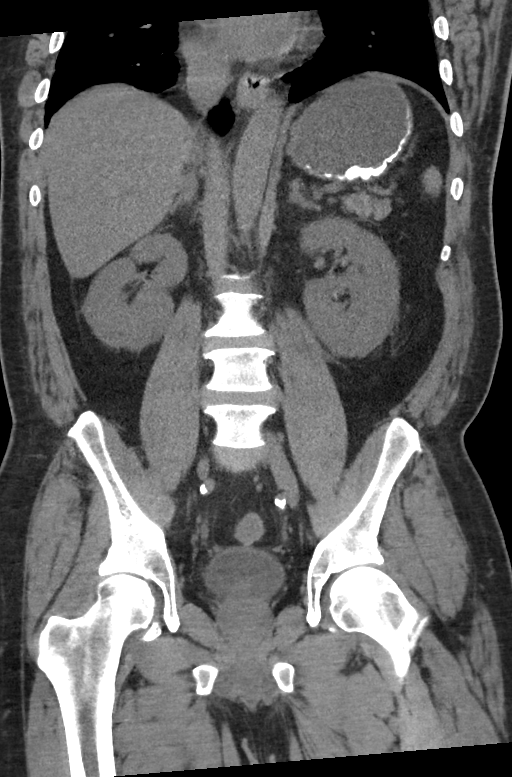
[im 58/104  soft-tissue]
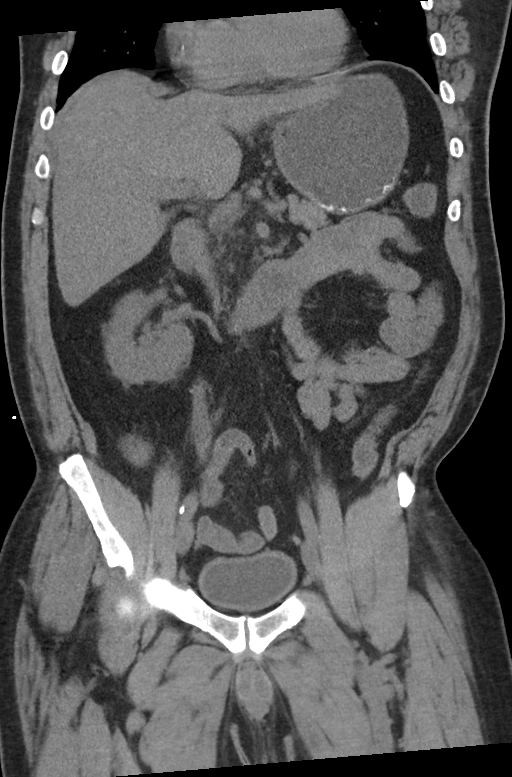

[15 of 46 positions shown; findings below may reference images not displayed]

FINDINGS: Lower chest: No acute abnormality.

Hepatobiliary: No solid liver abnormality is seen. No gallstones,
gallbladder wall thickening, or biliary dilatation.

Pancreas: Unremarkable. No pancreatic ductal dilatation or
surrounding inflammatory changes.

Spleen: Normal in size without significant abnormality.

Adrenals/Urinary Tract: Adrenal glands are unremarkable. New
bilateral perinephric fat stranding. Small nonobstructive calculus
of the inferior pole of left kidney. No right-sided calculi,
ureteral calculi, or hydronephrosis. Bladder is unremarkable.

Stomach/Bowel: Stomach is within normal limits. Appendix appears
normal. No evidence of bowel wall thickening, distention, or
inflammatory changes. Colon is fluid-filled to the rectum.

Vascular/Lymphatic: Aortic atherosclerosis. No enlarged abdominal or
pelvic lymph nodes.

Reproductive: No mass or other significant abnormality.

Other: No abdominal wall hernia or abnormality. No ascites.

Musculoskeletal: No acute or significant osseous findings.
IMPRESSION: 1. Colon is fluid-filled to the rectum, suggestive of diarrheal
illness.
2. New bilateral perinephric fat stranding, suggesting infection or
inflammation. Correlate with urinalysis.
3. Small nonobstructive calculus of the inferior pole of left
kidney. No right-sided calculi, ureteral calculi, or hydronephrosis.

Aortic Atherosclerosis ([id number]).

## 2022-10-22 ENCOUNTER — Emergency Department (HOSPITAL_BASED_OUTPATIENT_CLINIC_OR_DEPARTMENT_OTHER): Payer: No Typology Code available for payment source

## 2022-10-22 ENCOUNTER — Emergency Department (HOSPITAL_BASED_OUTPATIENT_CLINIC_OR_DEPARTMENT_OTHER)
Admission: EM | Admit: 2022-10-22 | Discharge: 2022-10-22 | Disposition: A | Discharge status: Home / Self Care | Payer: No Typology Code available for payment source | Attending: Emergency Medicine | Admitting: Emergency Medicine

## 2022-10-22 ENCOUNTER — Other Ambulatory Visit: Payer: Self-pay

## 2022-10-22 ENCOUNTER — Encounter (HOSPITAL_BASED_OUTPATIENT_CLINIC_OR_DEPARTMENT_OTHER): Payer: Self-pay

## 2022-10-22 DIAGNOSIS — I10 Essential (primary) hypertension: Secondary | ICD-10-CM | POA: Diagnosis not present

## 2022-10-22 DIAGNOSIS — Y9301 Activity, walking, marching and hiking: Secondary | ICD-10-CM | POA: Diagnosis not present

## 2022-10-22 DIAGNOSIS — S29019A Strain of muscle and tendon of unspecified wall of thorax, initial encounter: Secondary | ICD-10-CM | POA: Insufficient documentation

## 2022-10-22 DIAGNOSIS — M546 Pain in thoracic spine: Secondary | ICD-10-CM | POA: Diagnosis present

## 2022-10-22 DIAGNOSIS — W109XXA Fall (on) (from) unspecified stairs and steps, initial encounter: Secondary | ICD-10-CM | POA: Diagnosis not present

## 2022-10-22 MED ORDER — DICLOFENAC SODIUM 1 % EX GEL: 2.0000 g | Freq: Four times a day (QID) | CUTANEOUS | 0 refills | Status: AC | PRN

## 2022-10-22 MED ORDER — DICLOFENAC SODIUM 1 % EX GEL: 2.0000 g | Freq: Four times a day (QID) | CUTANEOUS | 0 refills | Status: DC | PRN

## 2022-10-22 MED ORDER — KETOROLAC TROMETHAMINE 30 MG/ML IJ SOLN
30.0000 mg | Freq: Once | INTRAMUSCULAR | Status: AC
Start: 2022-10-22 — End: 2022-10-22
  Administered 2022-10-22: 30 mg via INTRAMUSCULAR
  Filled 2022-10-22: qty 1

## 2022-10-22 MED ORDER — METHOCARBAMOL 500 MG PO TABS: 500.0000 mg | ORAL_TABLET | Freq: Three times a day (TID) | ORAL | 0 refills | Status: DC | PRN

## 2022-10-22 MED ORDER — METHOCARBAMOL 500 MG PO TABS: 500.0000 mg | ORAL_TABLET | Freq: Three times a day (TID) | ORAL | 0 refills | Status: AC | PRN

## 2022-10-22 NOTE — ED Notes (Signed)
Discharge instructions reviewed with patient. Patient questions answered and opportunity for education reviewed. Patient voices understanding of discharge instructions with no further questions. Patient ambulatory with steady gait to lobby.  

## 2022-10-22 NOTE — ED Provider Notes (Signed)
Emergency Department Provider Note   I have reviewed the triage vital signs and the nursing notes.   HISTORY  Chief Complaint Back Pain   HPI Terry Peters is a 62 y.o. male past history reviewed below presents to the emergency department with left shoulder/mid back pain after a fall 1 week ago.  Patient was walking on the stairs and when he reached the bottom slipped causing him to fall backward.  He turned to fall on his left side and has had pain in the shoulder and mid back since.  No numbness or weakness.  Occasional pain shooting to the left elbow.  Pain somewhat worse with moving or touching the area.  He is tried over-the-counter Tylenol, Aleve/ibuprofen without lasting relief. No CP or SOB.   Past Medical History:  Diagnosis Date   Alcohol abuse    Anemia    Anxiety    ARF (acute renal failure) (HCC) 09/2014   DVT of proximal leg (deep vein thrombosis) (HCC)    High blood pressure    Hypertension     Review of Systems  Constitutional: No fever/chills Cardiovascular: Denies chest pain. Respiratory: Denies shortness of breath. Gastrointestinal: No abdominal pain.  Musculoskeletal: Positive left shoulder blade pain and mid-back pain.  Skin: Negative for rash. Neurological: Negative for headaches, focal weakness or numbness.  ____________________________________________   PHYSICAL EXAM:  VITAL SIGNS: ED Triage Vitals [10/22/22 0121]  Enc Vitals Group     BP (!) 173/107     Pulse Rate 90     Resp 18     Temp 97.7 F (36.5 C)     Temp Source Oral     SpO2 100 %     Weight 190 lb (86.2 kg)     Height 5\' 9"  (1.753 m)   Constitutional: Alert and oriented. Well appearing and in no acute distress. Eyes: Conjunctivae are normal.  Head: Atraumatic. Nose: No congestion/rhinnorhea. Mouth/Throat: Mucous membranes are moist.   Neck: No stridor.  No cervical spine tenderness to palpation. Cardiovascular: Normal rate, regular rhythm. Good peripheral circulation.  Grossly normal heart sounds.   Respiratory: Normal respiratory effort.  No retractions. Lungs CTAB. Gastrointestinal: Soft and nontender. No distention.  Musculoskeletal: Normal range of motion of the left shoulder with some posterior scapular tenderness along the medial border and left paraspinal musculature of the thoracic spine.  No midline thoracic or lumbar spine tenderness. No elbow tenderness.  Neurologic:  Normal speech and language. No gross focal neurologic deficits are appreciated.  Skin:  Skin is warm, dry and intact. No rash noted.  ____________________________________________  RADIOLOGY  DG Shoulder Left  Result Date: 10/22/2022 CLINICAL DATA:  Status post recent fall. EXAM: LEFT SHOULDER - 2+ VIEW COMPARISON:  None Available. FINDINGS: There is no evidence of fracture or dislocation. There is no evidence of arthropathy or other focal bone abnormality. Soft tissues are unremarkable. IMPRESSION: Negative. Electronically Signed   By: Aram Candela M.D.   On: 10/22/2022 02:21   DG Chest 2 View  Result Date: 10/22/2022 CLINICAL DATA:  Status post recent fall. EXAM: CHEST - 2 VIEW COMPARISON:  July 04, 2021 FINDINGS: The heart size and mediastinal contours are within normal limits. Both lungs are clear. The visualized skeletal structures are unremarkable. IMPRESSION: No active cardiopulmonary disease. Electronically Signed   By: Aram Candela M.D.   On: 10/22/2022 02:20    ____________________________________________   PROCEDURES  Procedure(s) performed:   Procedures  None  ____________________________________________   INITIAL IMPRESSION / ASSESSMENT  AND PLAN / ED COURSE  Pertinent labs & imaging results that were available during my care of the patient were reviewed by me and considered in my medical decision making (see chart for details).   This patient is Presenting for Evaluation of back pain, which does require a range of treatment options, and is a  complaint that involves a moderate risk of morbidity and mortality.  The Differential Diagnoses include contusion, sprain, MSK strain, fracture, dislocation, etc.  Critical Interventions-    Medications  ketorolac (TORADOL) 30 MG/ML injection 30 mg (30 mg Intramuscular Given 10/22/22 0144)    Reassessment after intervention: pain improved.   Radiologic Tests Ordered, included CXR and shoulder XR. I independently interpreted the images and agree with radiology interpretation.   Medical Decision Making: Summary:  Patient presents to the emergency department with left shoulder pain mainly over the scapular area and paraspinal musculature of the thoracic spine.  This occurred in the setting of trauma.  Considered ACS/PE but give history and reproducible pain on exam, do not plan for labs or EKG.  Plan for x-rays and reassess.  Reevaluation with update and discussion with patient. No acute abnormalities. Plan for MSK relaxer and topical pain medication at home. Will continue to monitor symptoms and follow with PCP in the coming week if symptoms continue.   Patient's presentation is most consistent with acute, uncomplicated illness.   Disposition: discharge  ____________________________________________  FINAL CLINICAL IMPRESSION(S) / ED DIAGNOSES  Final diagnoses:  Thoracic myofascial strain, initial encounter     NEW OUTPATIENT MEDICATIONS STARTED DURING THIS VISIT:  Current Discharge Medication List     START taking these medications   Details  diclofenac Sodium (VOLTAREN) 1 % GEL Apply 2 g topically 4 (four) times daily as needed. Qty: 100 g, Refills: 0    methocarbamol (ROBAXIN) 500 MG tablet Take 1 tablet (500 mg total) by mouth every 8 (eight) hours as needed for muscle spasms. Qty: 20 tablet, Refills: 0        Note:  This document was prepared using Dragon voice recognition software and may include unintentional dictation errors.  Alona Bene, MD, [facility]    Orvel Cutsforth, Arlyss Repress, MD 10/22/22 [phone]

## 2022-10-22 NOTE — ED Triage Notes (Signed)
Back pain between shoulder blades x1 week. Pt reports a recent fall that caused pain. No LOC, did not hit head.

## 2022-10-22 NOTE — Discharge Instructions (Signed)
